# Patient Record
Sex: Female | Born: 1960 | Race: White | Hispanic: No | Marital: Married | Smoking: Never smoker
Health system: Southern US, Community
[De-identification: ages and names within clinical notes are randomized; demographics above are authoritative.]

## PROBLEM LIST (undated history)

## (undated) DIAGNOSIS — K859 Acute pancreatitis without necrosis or infection, unspecified: Secondary | ICD-10-CM

## (undated) DIAGNOSIS — R569 Unspecified convulsions: Secondary | ICD-10-CM

## (undated) HISTORY — PX: TONSILLECTOMY: SUR1361

## (undated) HISTORY — PX: APPENDECTOMY: SHX54

---

## 2001-05-29 ENCOUNTER — Inpatient Hospital Stay (HOSPITAL_COMMUNITY): Admission: EM | Admit: 2001-05-29 | Discharge: 2001-06-01 | Payer: Self-pay | Admitting: *Deleted

## 2004-06-01 ENCOUNTER — Emergency Department: Payer: Self-pay | Admitting: Emergency Medicine

## 2004-06-01 ENCOUNTER — Other Ambulatory Visit: Payer: Self-pay

## 2004-06-24 ENCOUNTER — Inpatient Hospital Stay: Payer: Self-pay

## 2004-08-06 ENCOUNTER — Emergency Department: Payer: Self-pay | Admitting: Emergency Medicine

## 2004-08-06 ENCOUNTER — Other Ambulatory Visit: Payer: Self-pay

## 2004-09-22 ENCOUNTER — Emergency Department: Payer: Self-pay | Admitting: Unknown Physician Specialty

## 2004-10-31 ENCOUNTER — Inpatient Hospital Stay: Payer: Self-pay | Admitting: Anesthesiology

## 2005-05-03 IMAGING — CR DG CHEST 1V PORT
1 series · 1 of 1 positions shown · non-contrast
Comparison: none

REASON FOR EXAM: chest pain   [HOSPITAL]
COMMENTS:

PROCEDURE:     DXR - DXR PORTABLE CHEST SINGLE VIEW  - September 22, 2004  [DATE]
RESULT:      The lungs are clear.  The heart and pulmonary vessels are
normal.  The bony and mediastinal structures are unremarkable.  There is no
effusion or pneumothorax.

[view not recorded]
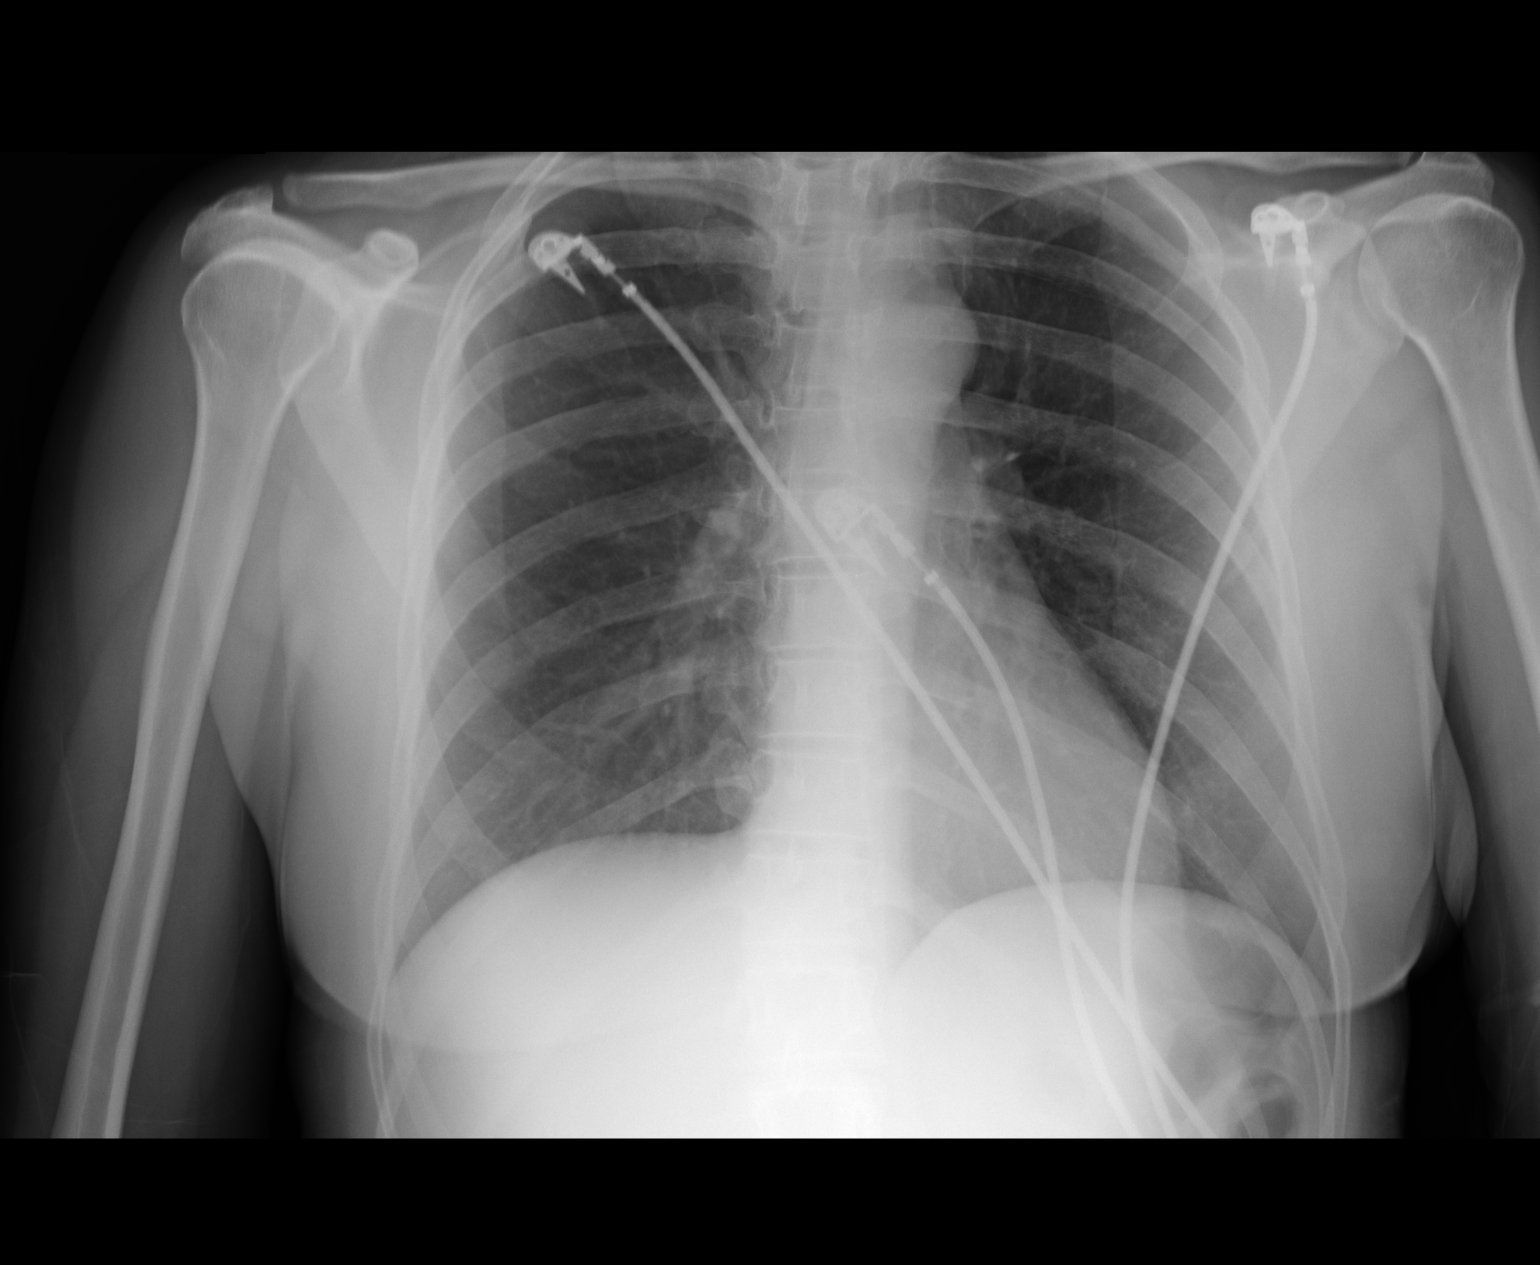

[1 of 1 positions shown; findings below may reference images not displayed]

IMPRESSION: No acute cardiopulmonary disease.

## 2018-07-13 ENCOUNTER — Other Ambulatory Visit: Payer: Self-pay

## 2018-07-13 ENCOUNTER — Inpatient Hospital Stay
Admission: EM | Admit: 2018-07-13 | Discharge: 2018-07-21 | DRG: 896 | Disposition: A | Discharge status: Home / Self Care | Payer: Self-pay | Attending: Internal Medicine | Admitting: Internal Medicine

## 2018-07-13 ENCOUNTER — Emergency Department: Payer: Self-pay

## 2018-07-13 DIAGNOSIS — F10230 Alcohol dependence with withdrawal, uncomplicated: Secondary | ICD-10-CM | POA: Diagnosis present

## 2018-07-13 DIAGNOSIS — K228 Other specified diseases of esophagus: Secondary | ICD-10-CM

## 2018-07-13 DIAGNOSIS — K222 Esophageal obstruction: Secondary | ICD-10-CM | POA: Diagnosis present

## 2018-07-13 DIAGNOSIS — F10929 Alcohol use, unspecified with intoxication, unspecified: Secondary | ICD-10-CM | POA: Diagnosis present

## 2018-07-13 DIAGNOSIS — K2289 Other specified disease of esophagus: Secondary | ICD-10-CM

## 2018-07-13 DIAGNOSIS — K219 Gastro-esophageal reflux disease without esophagitis: Secondary | ICD-10-CM | POA: Diagnosis present

## 2018-07-13 DIAGNOSIS — Z79899 Other long term (current) drug therapy: Secondary | ICD-10-CM

## 2018-07-13 DIAGNOSIS — K449 Diaphragmatic hernia without obstruction or gangrene: Secondary | ICD-10-CM | POA: Diagnosis present

## 2018-07-13 DIAGNOSIS — R600 Localized edema: Secondary | ICD-10-CM | POA: Diagnosis present

## 2018-07-13 DIAGNOSIS — R791 Abnormal coagulation profile: Secondary | ICD-10-CM | POA: Diagnosis present

## 2018-07-13 DIAGNOSIS — K7030 Alcoholic cirrhosis of liver without ascites: Secondary | ICD-10-CM | POA: Diagnosis present

## 2018-07-13 DIAGNOSIS — E8729 Other acidosis: Secondary | ICD-10-CM

## 2018-07-13 DIAGNOSIS — F10930 Alcohol use, unspecified with withdrawal, uncomplicated: Secondary | ICD-10-CM

## 2018-07-13 DIAGNOSIS — K852 Alcohol induced acute pancreatitis without necrosis or infection: Secondary | ICD-10-CM | POA: Diagnosis present

## 2018-07-13 DIAGNOSIS — F10229 Alcohol dependence with intoxication, unspecified: Principal | ICD-10-CM | POA: Diagnosis present

## 2018-07-13 DIAGNOSIS — E876 Hypokalemia: Secondary | ICD-10-CM | POA: Diagnosis present

## 2018-07-13 DIAGNOSIS — F10288 Alcohol dependence with other alcohol-induced disorder: Secondary | ICD-10-CM | POA: Diagnosis present

## 2018-07-13 DIAGNOSIS — E860 Dehydration: Secondary | ICD-10-CM | POA: Diagnosis present

## 2018-07-13 DIAGNOSIS — D61818 Other pancytopenia: Secondary | ICD-10-CM | POA: Diagnosis present

## 2018-07-13 DIAGNOSIS — K3189 Other diseases of stomach and duodenum: Secondary | ICD-10-CM | POA: Diagnosis present

## 2018-07-13 DIAGNOSIS — K766 Portal hypertension: Secondary | ICD-10-CM | POA: Diagnosis present

## 2018-07-13 DIAGNOSIS — E872 Acidosis: Secondary | ICD-10-CM | POA: Diagnosis present

## 2018-07-13 DIAGNOSIS — Z8669 Personal history of other diseases of the nervous system and sense organs: Secondary | ICD-10-CM

## 2018-07-13 DIAGNOSIS — Z885 Allergy status to narcotic agent status: Secondary | ICD-10-CM

## 2018-07-13 DIAGNOSIS — K2920 Alcoholic gastritis without bleeding: Secondary | ICD-10-CM | POA: Diagnosis present

## 2018-07-13 DIAGNOSIS — K760 Fatty (change of) liver, not elsewhere classified: Secondary | ICD-10-CM | POA: Diagnosis present

## 2018-07-13 DIAGNOSIS — R1314 Dysphagia, pharyngoesophageal phase: Secondary | ICD-10-CM | POA: Diagnosis present

## 2018-07-13 HISTORY — DX: Unspecified convulsions: R56.9

## 2018-07-13 LAB — CBC
HCT: 33.6 % — ABNORMAL LOW (ref 36.0–46.0)
Hemoglobin: 11.6 g/dL — ABNORMAL LOW (ref 12.0–15.0)
MCH: 31.2 pg (ref 26.0–34.0)
MCHC: 34.5 g/dL (ref 30.0–36.0)
MCV: 90.3 fL (ref 80.0–100.0)
Platelets: 87 10*3/uL — ABNORMAL LOW (ref 150–400)
RBC: 3.72 MIL/uL — ABNORMAL LOW (ref 3.87–5.11)
RDW: 15.6 % — ABNORMAL HIGH (ref 11.5–15.5)
WBC: 5.6 10*3/uL (ref 4.0–10.5)
nRBC: 0 % (ref 0.0–0.2)

## 2018-07-13 LAB — URINE DRUG SCREEN, QUALITATIVE (ARMC ONLY)
Amphetamines, Ur Screen: NOT DETECTED
Barbiturates, Ur Screen: NOT DETECTED
Benzodiazepine, Ur Scrn: NOT DETECTED
Cannabinoid 50 Ng, Ur ~~LOC~~: NOT DETECTED
Cocaine Metabolite,Ur ~~LOC~~: NOT DETECTED
MDMA (Ecstasy)Ur Screen: NOT DETECTED
METHADONE SCREEN, URINE: NOT DETECTED
Opiate, Ur Screen: NOT DETECTED
Phencyclidine (PCP) Ur S: NOT DETECTED
Tricyclic, Ur Screen: NOT DETECTED

## 2018-07-13 LAB — COMPREHENSIVE METABOLIC PANEL
ALT: 100 U/L — ABNORMAL HIGH (ref 0–44)
AST: 195 U/L — ABNORMAL HIGH (ref 15–41)
Albumin: 4.6 g/dL (ref 3.5–5.0)
Alkaline Phosphatase: 133 U/L — ABNORMAL HIGH (ref 38–126)
Anion gap: 22 — ABNORMAL HIGH (ref 5–15)
BUN: 10 mg/dL (ref 6–20)
CO2: 20 mmol/L — ABNORMAL LOW (ref 22–32)
Calcium: 9 mg/dL (ref 8.9–10.3)
Chloride: 90 mmol/L — ABNORMAL LOW (ref 98–111)
Creatinine, Ser: 0.46 mg/dL (ref 0.44–1.00)
Glucose, Bld: 174 mg/dL — ABNORMAL HIGH (ref 70–99)
Potassium: 3 mmol/L — ABNORMAL LOW (ref 3.5–5.1)
Sodium: 132 mmol/L — ABNORMAL LOW (ref 135–145)
Total Bilirubin: 3.6 mg/dL — ABNORMAL HIGH (ref 0.3–1.2)
Total Protein: 8.3 g/dL — ABNORMAL HIGH (ref 6.5–8.1)

## 2018-07-13 LAB — BLOOD GAS, VENOUS
Acid-base deficit: 3.3 mmol/L — ABNORMAL HIGH (ref 0.0–2.0)
Bicarbonate: 19.9 mmol/L — ABNORMAL LOW (ref 20.0–28.0)
O2 Saturation: 95.5 %
Patient temperature: 37
pCO2, Ven: 30 mmHg — ABNORMAL LOW (ref 44.0–60.0)
pH, Ven: 7.43 (ref 7.250–7.430)
pO2, Ven: 76 mmHg — ABNORMAL HIGH (ref 32.0–45.0)

## 2018-07-13 LAB — LIPASE, BLOOD
Lipase: 343 U/L — ABNORMAL HIGH (ref 11–51)
Lipase: 317 U/L — ABNORMAL HIGH (ref 11–51)

## 2018-07-13 LAB — TSH: TSH: 0.491 u[IU]/mL (ref 0.350–4.500)

## 2018-07-13 LAB — ETHANOL: ALCOHOL ETHYL (B): 112 mg/dL — AB (ref <10)

## 2018-07-13 MED ORDER — METOCLOPRAMIDE HCL 5 MG/ML IJ SOLN
10.0000 mg | Freq: Once | INTRAMUSCULAR | Status: AC
  Administered 2018-07-13: 10 mg via INTRAVENOUS
  Filled 2018-07-13: qty 2

## 2018-07-13 MED ORDER — LORAZEPAM 2 MG/ML IJ SOLN
0.0000 mg | Freq: Four times a day (QID) | INTRAMUSCULAR | Status: AC
  Administered 2018-07-14: 1 mg via INTRAVENOUS
  Filled 2018-07-13: qty 1

## 2018-07-13 MED ORDER — TRAZODONE HCL 100 MG PO TABS
100.0000 mg | ORAL_TABLET | Freq: Every day | ORAL | Status: DC
  Administered 2018-07-13 – 2018-07-20 (×8): 100 mg via ORAL
  Filled 2018-07-13 (×8): qty 1

## 2018-07-13 MED ORDER — ONDANSETRON HCL 4 MG/2ML IJ SOLN
4.0000 mg | Freq: Four times a day (QID) | INTRAMUSCULAR | Status: DC | PRN
  Administered 2018-07-13 – 2018-07-19 (×14): 4 mg via INTRAVENOUS
  Filled 2018-07-13 (×15): qty 2

## 2018-07-13 MED ORDER — DILTIAZEM HCL 25 MG/5ML IV SOLN
15.0000 mg | Freq: Once | INTRAVENOUS | Status: DC
  Filled 2018-07-13: qty 5

## 2018-07-13 MED ORDER — SODIUM CHLORIDE 0.9 % IV BOLUS
2000.0000 mL | Freq: Once | INTRAVENOUS | Status: AC
  Administered 2018-07-13: 2000 mL via INTRAVENOUS

## 2018-07-13 MED ORDER — ADULT MULTIVITAMIN W/MINERALS CH
1.0000 | ORAL_TABLET | Freq: Every day | ORAL | Status: DC
  Administered 2018-07-14 – 2018-07-21 (×8): 1 via ORAL
  Filled 2018-07-13 (×8): qty 1

## 2018-07-13 MED ORDER — IOPAMIDOL (ISOVUE-300) INJECTION 61%
75.0000 mL | Freq: Once | INTRAVENOUS | Status: AC | PRN
  Administered 2018-07-13: 75 mL via INTRAVENOUS

## 2018-07-13 MED ORDER — IOPAMIDOL (ISOVUE-300) INJECTION 61%: 100.0000 mL | Freq: Once | INTRAVENOUS | Status: DC | PRN

## 2018-07-13 MED ORDER — POTASSIUM CHLORIDE IN NACL 20-0.9 MEQ/L-% IV SOLN
INTRAVENOUS | Status: DC
  Administered 2018-07-14: 04:00:00 via INTRAVENOUS
  Filled 2018-07-13: qty 1000

## 2018-07-13 MED ORDER — LORAZEPAM 1 MG PO TABS
1.0000 mg | ORAL_TABLET | Freq: Four times a day (QID) | ORAL | Status: AC | PRN
  Administered 2018-07-13: 1 mg via ORAL
  Filled 2018-07-13: qty 1

## 2018-07-13 MED ORDER — POTASSIUM CHLORIDE 10 MEQ/100ML IV SOLN
10.0000 meq | INTRAVENOUS | Status: DC
  Filled 2018-07-13 (×6): qty 100

## 2018-07-13 MED ORDER — LORAZEPAM 2 MG/ML IJ SOLN: 0.0000 mg | Freq: Two times a day (BID) | INTRAMUSCULAR | Status: AC

## 2018-07-13 MED ORDER — ACETAMINOPHEN 325 MG PO TABS
650.0000 mg | ORAL_TABLET | Freq: Four times a day (QID) | ORAL | Status: DC | PRN
  Filled 2018-07-13: qty 2

## 2018-07-13 MED ORDER — POTASSIUM CHLORIDE IN NACL 20-0.9 MEQ/L-% IV SOLN
INTRAVENOUS | Status: DC
  Filled 2018-07-13 (×2): qty 1000

## 2018-07-13 MED ORDER — PANTOPRAZOLE SODIUM 40 MG PO TBEC
40.0000 mg | DELAYED_RELEASE_TABLET | Freq: Two times a day (BID) | ORAL | Status: DC
  Administered 2018-07-13 – 2018-07-16 (×6): 40 mg via ORAL
  Filled 2018-07-13 (×6): qty 1

## 2018-07-13 MED ORDER — PANTOPRAZOLE SODIUM 40 MG IV SOLR
40.0000 mg | Freq: Once | INTRAVENOUS | Status: AC
  Administered 2018-07-13: 40 mg via INTRAVENOUS
  Filled 2018-07-13: qty 40

## 2018-07-13 MED ORDER — POTASSIUM CHLORIDE 10 MEQ/100ML IV SOLN
10.0000 meq | INTRAVENOUS | Status: AC
  Administered 2018-07-13 – 2018-07-14 (×5): 10 meq via INTRAVENOUS
  Filled 2018-07-13 (×5): qty 100

## 2018-07-13 MED ORDER — LORAZEPAM 2 MG/ML IJ SOLN
1.0000 mg | Freq: Four times a day (QID) | INTRAMUSCULAR | Status: AC | PRN
  Filled 2018-07-13: qty 1

## 2018-07-13 MED ORDER — ONDANSETRON HCL 4 MG PO TABS
4.0000 mg | ORAL_TABLET | Freq: Four times a day (QID) | ORAL | Status: DC | PRN
  Administered 2018-07-18: 4 mg via ORAL
  Filled 2018-07-13: qty 1

## 2018-07-13 MED ORDER — LORAZEPAM 2 MG/ML IJ SOLN
4.0000 mg | Freq: Once | INTRAMUSCULAR | Status: AC
  Administered 2018-07-13: 4 mg via INTRAVENOUS
  Filled 2018-07-13: qty 2

## 2018-07-13 MED ORDER — FOLIC ACID 1 MG PO TABS
1.0000 mg | ORAL_TABLET | Freq: Every day | ORAL | Status: DC
  Administered 2018-07-14 – 2018-07-21 (×8): 1 mg via ORAL
  Filled 2018-07-13 (×8): qty 1

## 2018-07-13 MED ORDER — ENOXAPARIN SODIUM 40 MG/0.4ML ~~LOC~~ SOLN
40.0000 mg | SUBCUTANEOUS | Status: DC
  Administered 2018-07-13: 40 mg via SUBCUTANEOUS
  Filled 2018-07-13: qty 0.4

## 2018-07-13 MED ORDER — THIAMINE HCL 100 MG/ML IJ SOLN
100.0000 mg | Freq: Every day | INTRAMUSCULAR | Status: DC
  Administered 2018-07-13: 100 mg via INTRAVENOUS
  Filled 2018-07-13: qty 2

## 2018-07-13 MED ORDER — VITAMIN B-1 100 MG PO TABS
100.0000 mg | ORAL_TABLET | Freq: Every day | ORAL | Status: DC
  Administered 2018-07-14 – 2018-07-21 (×8): 100 mg via ORAL
  Filled 2018-07-13 (×8): qty 1

## 2018-07-13 MED ORDER — ACETAMINOPHEN 650 MG RE SUPP: 650.0000 mg | Freq: Four times a day (QID) | RECTAL | Status: DC | PRN

## 2018-07-13 MED ORDER — ONDANSETRON HCL 4 MG/2ML IJ SOLN
4.0000 mg | Freq: Once | INTRAMUSCULAR | Status: AC
  Administered 2018-07-13: 4 mg via INTRAVENOUS
  Filled 2018-07-13: qty 2

## 2018-07-13 NOTE — ED Notes (Signed)
Pt transported to room 209 

## 2018-07-13 NOTE — ED Triage Notes (Signed)
Pt arrives via ems from home. Ems reports pt drinking 1 pint of liquor this morning. Pt c/o epigastric/chest pain. Oriented to self and situation on arrival, unable to tell me what month it is or what time she consumed the alcohol this morning. Pt extremely nauseous and dry heaving on arrival. Pt states she did have one episode of emesis, describes it as clear.

## 2018-07-13 NOTE — ED Notes (Signed)
ED TO INPATIENT HANDOFF REPORT  Name/Age/Gender Kaitlyn Farrell 58 y.o. female  Code Status   Home/SNF/Other Home  Chief Complaint chest pain  Level of Care/Admitting Diagnosis ED Disposition    ED Disposition Condition Comment   Admit  Hospital Area: [facility] [[id number]]  Level of Care: Med-Surg [16]  Diagnosis: Alcohol intoxication [facility]) [[id number]]  Admitting Physician: Enid Baas [[id number]]  Attending Physician: Enid Baas [[id number]]  Estimated length of stay: past midnight tomorrow  Certification:: I certify this patient will need inpatient services for at least 2 midnights  PT Class (Do Not Modify): Inpatient [101]  PT Acc Code (Do Not Modify): Private [1]       Medical History Past Medical History:  Diagnosis Date  . Seizures (HCC)     Allergies Allergies  Allergen Reactions  . Hydrocodone Itching and Rash    IV Location/Drains/Wounds Patient Lines/Drains/Airways Status   Active Line/Drains/Airways    Name:   Placement date:   Placement time:   Site:   Days:   Peripheral IV 07/13/18 Right Forearm   07/13/18    0930    Forearm   less than 1          Labs/Imaging Results for orders placed or performed during the hospital encounter of 07/13/18 (from the past 48 hour(s))  Comprehensive metabolic panel     Status: Abnormal   Collection Time: 07/13/18  9:30 AM  Result Value Ref Range   Sodium 132 (L) 135 - 145 mmol/L    Comment: RESULTS VERIFIED BY REPEAT TESTING /HKP   Potassium 3.0 (L) 3.5 - 5.1 mmol/L   Chloride 90 (L) 98 - 111 mmol/L   CO2 20 (L) 22 - 32 mmol/L   Glucose, Bld 174 (H) 70 - 99 mg/dL   BUN 10 6 - 20 mg/dL   Creatinine, Ser 3.25 0.44 - 1.00 mg/dL   Calcium 9.0 8.9 - 49.8 mg/dL   Total Protein 8.3 (H) 6.5 - 8.1 g/dL   Albumin 4.6 3.5 - 5.0 g/dL   AST 264 (H) 15 - 41 U/L   ALT 100 (H) 0 - 44 U/L   Alkaline Phosphatase 133 (H) 38 - 126 U/L   Total Bilirubin 3.6 (H) 0.3 - 1.2 mg/dL   GFR calc non  Af Amer >60 >60 mL/min   GFR calc Af Amer >60 >60 mL/min   Anion gap 22 (H) 5 - 15    Comment: Performed at [facility], [address] [place], [place] [address]  cbc     Status: Abnormal   Collection Time: 07/13/18  9:30 AM  Result Value Ref Range   WBC 5.6 4.0 - 10.5 K/uL   RBC 3.72 (L) 3.87 - 5.11 MIL/uL   Hemoglobin 11.6 (L) 12.0 - 15.0 g/dL   HCT 94.0 (L) 76.8 - 08.8 %   MCV 90.3 80.0 - 100.0 fL   MCH 31.2 26.0 - 34.0 pg   MCHC 34.5 30.0 - 36.0 g/dL   RDW 11.0 (H) 31.5 - 94.5 %   Platelets 87 (L) 150 - 400 K/uL    Comment: Immature Platelet Fraction may be clinically indicated, consider ordering this additional test [id number]    nRBC 0.0 0.0 - 0.2 %    Comment: Performed at [facility], [address] [place], [place] [address]  Lipase, blood     Status: Abnormal   Collection Time: 07/13/18  9:30 AM  Result Value Ref Range   Lipase 343 (H) 11 -  51 U/L    Comment: Performed at [facility], [address] [place], [place] [address]  Blood gas, venous     Status: Abnormal   Collection Time: 07/13/18 10:05 AM  Result Value Ref Range   pH, Ven 7.43 7.250 - 7.430   pCO2, Ven 30 (L) 44.0 - 60.0 mmHg   pO2, Ven 76.0 (H) 32.0 - 45.0 mmHg   Bicarbonate 19.9 (L) 20.0 - 28.0 mmol/L   Acid-base deficit 3.3 (H) 0.0 - 2.0 mmol/L   O2 Saturation 95.5 %   Patient temperature 37.0    Collection site VEIN    Sample type VENOUS     Comment: Performed at [facility], [address] [place], [place] [address]  Ethanol     Status: Abnormal   Collection Time: 07/13/18 10:15 AM  Result Value Ref Range   Alcohol, Ethyl (B) 112 (H) <10 mg/dL    Comment: (NOTE) Lowest detectable limit for serum alcohol is 10 mg/dL. For medical purposes only. Performed at [facility], [address] [place], [place] [address]    Ct Head Wo Contrast  Result Date: 07/13/2018 CLINICAL DATA:  Head trauma. EXAM: CT HEAD WITHOUT CONTRAST TECHNIQUE:  Contiguous axial images were obtained from the base of the skull through the vertex without intravenous contrast. COMPARISON:  None. FINDINGS: Brain: Mild atrophy. Negative for hydrocephalus. Negative for acute infarct, hemorrhage, or mass. Vascular: Negative for hyperdense vessel Skull: Negative for skull fracture.  Right parietal scalp contusion Sinuses/Orbits: Negative Other: None IMPRESSION: No acute abnormality.  Generalized atrophy. Electronically Signed   By: Marlan Palau M.D.   On: 07/13/2018 11:43   Ct Abdomen Pelvis W Contrast  Result Date: 07/13/2018 CLINICAL DATA:  Epigastric pain following drinking alcohol, initial encounter EXAM: CT ABDOMEN AND PELVIS WITH CONTRAST TECHNIQUE: Multidetector CT imaging of the abdomen and pelvis was performed using the standard protocol following bolus administration of intravenous contrast. CONTRAST:  64mL ISOVUE-300 IOPAMIDOL (ISOVUE-300) INJECTION 61% COMPARISON:  None. FINDINGS: Lower chest: No acute abnormality. Hepatobiliary: Diffuse fatty infiltration of the liver is noted. The gallbladder is well distended. Pancreas: Unremarkable. No pancreatic ductal dilatation or surrounding inflammatory changes. Spleen: Normal in size without focal abnormality. Adrenals/Urinary Tract: The adrenal glands are within normal limits. Kidneys demonstrate no renal calculi or obstructive changes. Normal excretion of contrast is noted on delayed images. Bladder is well distended. Stomach/Bowel: Scattered diverticular change of the colon is noted without evidence of diverticulitis. The appendix is not well visualized consistent with a prior surgical history. No small bowel obstructive or inflammatory changes are seen. A duodenal diverticulum is noted adjacent to the uncinate process of the pancreas. Stomach is decompressed. Small sliding-type hiatal hernia is noted. Some thickening of the distal esophagus is noted which may be related to reflux and mucosal edema although the  possibility of an obstructing mass could not be totally excluded. Fluoroscopic evaluation may be helpful. Vascular/Lymphatic: Aortic atherosclerosis. No enlarged abdominal or pelvic lymph nodes. Reproductive: Uterus and bilateral adnexa are unremarkable. Other: No abdominal wall hernia or abnormality. No abdominopelvic ascites. Musculoskeletal: Degenerative changes of the lumbar spine are noted. IMPRESSION: Thickening in the distal esophagus which may be related to mucosal edema from reflux although the possibility of a filling defect could not be totally excluded. Fluoroscopic evaluation is recommended. Fatty infiltration of the liver. Chronic changes without acute abnormality. Electronically Signed   By: Alcide Clever M.D.   On: 07/13/2018 11:46    Pending Labs [employer] (From  admission, onward)    Start     Ordered   07/13/18 1234  Lipase, blood  ONCE - STAT,   STAT     07/13/18 1233   07/13/18 0930  Urine Drug Screen, Qualitative  Once,   STAT     07/13/18 0930   Signed and Held  HIV antibody (Routine Testing)  Once,   R     Signed and Held   Signed and Held  TSH  Once,   R     Signed and Held   Signed and Held  Comprehensive metabolic panel  Tomorrow morning,   R     Signed and Held   Medical illustratorigned and Held  CBC  Tomorrow morning,   R     Signed and Held   Signed and Held  Protime-INR  Tomorrow morning,   R     Signed and Held   Signed and Held  Lipase, blood  Tomorrow morning,   R     Signed and Held          Vitals/Pain Today's Vitals   07/13/18 1030 07/13/18 1100 07/13/18 1200 07/13/18 1213  BP: 128/88 (!) 146/84 (!) 144/87 (!) 144/87  Pulse:   96 (!) 105  Resp: 18 16    Temp:      TempSrc:      SpO2:   95%   Weight:      Height:      PainSc:        Isolation Precautions No active isolations  Medications Medications  potassium chloride 10 mEq in 100 mL IVPB (has no administration in time range)  sodium chloride 0.9 % bolus 2,000 mL (0 mLs Intravenous Stopped  07/13/18 1211)  ondansetron (ZOFRAN) injection 4 mg (4 mg Intravenous Given 07/13/18 1018)  pantoprazole (PROTONIX) injection 40 mg (40 mg Intravenous Given 07/13/18 1026)  metoCLOPramide (REGLAN) injection 10 mg (10 mg Intravenous Given 07/13/18 1019)  iopamidol (ISOVUE-300) 61 % injection 75 mL (75 mLs Intravenous Contrast Given 07/13/18 1119)  LORazepam (ATIVAN) injection 4 mg (4 mg Intravenous Given 07/13/18 1215)    Mobility walks with person assist

## 2018-07-13 NOTE — ED Notes (Signed)
Resting.  NAD.  Skin warm and dry.

## 2018-07-13 NOTE — ED Provider Notes (Signed)
[facility] Emergency Department Provider Note  ____________________________________________  Time seen: Approximately 12:03 PM  I have reviewed the triage vital signs and the nursing notes.   HISTORY  Chief Complaint Alcohol Intoxication and Chest Pain  Level 5 Caveat: Portions of the History and Physical including HPI and review of systems are unable to be completely obtained due to patient being a poor historian    HPI Oneida ArenasMarilyn M Parkerson is a 58 y.o. female with a history of alcohol abuse and seizures who reports drinking approximately half a gallon of liquor daily for the past week.  Today she has generalized abdominal pain and vomiting.  No diarrhea.  No black or bloody stool.  No hematemesis.  Denies dizziness or syncope.      Past Medical History:  Diagnosis Date  . Seizures (HCC)      There are no active problems to display for this patient.    Past Surgical History:  Procedure Laterality Date  . APPENDECTOMY    . TONSILLECTOMY       Prior to Admission medications   Medication Sig Start Date End Date Taking? Authorizing Provider  esomeprazole (NEXIUM) 20 MG capsule Take 20 mg by mouth daily at 12 noon.   Yes [provider]  traZODone (DESYREL) 100 MG tablet Take 100 mg by mouth at bedtime.   Yes [provider]     Allergies Hydrocodone   History reviewed. No pertinent family history.  Social History Social History   Tobacco Use  . Smoking status: Never Smoker  . Smokeless tobacco: Never Used  Substance Use Topics  . Alcohol use: Yes  . Drug use: Never    Review of Systems  Constitutional:   No fever or chills.  ENT:   No sore throat. No rhinorrhea. Cardiovascular:   No chest pain or syncope. Respiratory:   No dyspnea or cough. Gastrointestinal: Positive upper abdominal pain and vomiting. Musculoskeletal:   Negative for focal pain or swelling All other systems reviewed and are negative except as  documented above in ROS and HPI.  ____________________________________________   PHYSICAL EXAM:  VITAL SIGNS: ED Triage Vitals  Enc Vitals Group     BP 07/13/18 0924 137/88     Pulse Rate 07/13/18 0924 (!) 125     Resp 07/13/18 0924 18     Temp 07/13/18 0924 98.6 F (37 C)     Temp Source 07/13/18 0924 Oral     SpO2 07/13/18 0924 96 %     Weight 07/13/18 0925 105 lb (47.6 kg)     Height 07/13/18 0925 5\' 2"  (1.575 m)     Head Circumference --      Peak Flow --      Pain Score 07/13/18 0925 8     Pain Loc --      Pain Edu? --      Excl. in GC? --     Vital signs reviewed, nursing assessments reviewed.   Constitutional:   Alert and oriented.  Ill-appearing Eyes:   Conjunctivae are normal. EOMI. PERRL. ENT      Head:   Normocephalic and atraumatic.      Nose:   No congestion/rhinnorhea.       Mouth/Throat:   Dry mucous membranes, no pharyngeal erythema. No peritonsillar mass.       Neck:   No meningismus. Full ROM. Hematological/Lymphatic/Immunilogical:   No cervical lymphadenopathy. Cardiovascular:   Tachycardia heart rate 110, regular. Symmetric bilateral radial and DP pulses.  No murmurs. Cap refill less than 2 seconds. Respiratory:   Normal respiratory effort without tachypnea/retractions. Breath sounds are clear and equal bilaterally. No wheezes/rales/rhonchi. Gastrointestinal:   Soft with diffuse upper abdominal tenderness.. Non distended. There is no CVA tenderness.  No rebound, rigidity, or guarding. Musculoskeletal:   Normal range of motion in all extremities. No joint effusions.  No lower extremity tenderness.  No edema. Neurologic:   Normal speech and language.  Very tremulous Motor grossly intact. Finger-to-nose normal No acute focal neurologic deficits are appreciated.  Skin:    Skin is warm, dry and intact. No rash noted.  No petechiae, purpura, or bullae.  ____________________________________________    LABS (pertinent positives/negatives) (all labs  ordered are listed, but only abnormal results are displayed) Labs Reviewed  COMPREHENSIVE METABOLIC PANEL - Abnormal; Notable for the following components:      Result Value   Sodium 132 (*)    Potassium 3.0 (*)    Chloride 90 (*)    CO2 20 (*)    Glucose, Bld 174 (*)    Total Protein 8.3 (*)    AST 195 (*)    ALT 100 (*)    Alkaline Phosphatase 133 (*)    Total Bilirubin 3.6 (*)    Anion gap 22 (*)    All other components within normal limits  CBC - Abnormal; Notable for the following components:   RBC 3.72 (*)    Hemoglobin 11.6 (*)    HCT 33.6 (*)    RDW 15.6 (*)    Platelets 87 (*)    All other components within normal limits  ETHANOL - Abnormal; Notable for the following components:   Alcohol, Ethyl (B) 112 (*)    All other components within normal limits  BLOOD GAS, VENOUS - Abnormal; Notable for the following components:   pCO2, Ven 30 (*)    pO2, Ven 76.0 (*)    Bicarbonate 19.9 (*)    Acid-base deficit 3.3 (*)    All other components within normal limits  LIPASE, BLOOD - Abnormal; Notable for the following components:   Lipase 343 (*)    All other components within normal limits  URINE DRUG SCREEN, QUALITATIVE ([facility] ONLY)  LIPASE, BLOOD  POC URINE PREG, ED   ____________________________________________   EKG  Interpreted by me Atrial fibrillation rate 142, normal axis intervals QRS ST segments and T waves.  Repeat EKG performed at 10:51 AM interpretation performed by me but limited by tremor and baseline artifact Sinus rhythm rate of 99, QTc 515, normal axis.  Normal QRS ST segments and T waves.   ____________________________________________    RADIOLOGY  Ct Head Wo Contrast  Result Date: 07/13/2018 CLINICAL DATA:  Head trauma. EXAM: CT HEAD WITHOUT CONTRAST TECHNIQUE: Contiguous axial images were obtained from the base of the skull through the vertex without intravenous contrast. COMPARISON:  None. FINDINGS: Brain: Mild atrophy. Negative for  hydrocephalus. Negative for acute infarct, hemorrhage, or mass. Vascular: Negative for hyperdense vessel Skull: Negative for skull fracture.  Right parietal scalp contusion Sinuses/Orbits: Negative Other: None IMPRESSION: No acute abnormality.  Generalized atrophy. Electronically Signed   By: Marlan Palauharles  Clark M.D.   On: 07/13/2018 11:43   Ct Abdomen Pelvis W Contrast  Result Date: 07/13/2018 CLINICAL DATA:  Epigastric pain following drinking alcohol, initial encounter EXAM: CT ABDOMEN AND PELVIS WITH CONTRAST TECHNIQUE: Multidetector CT imaging of the abdomen and pelvis was performed using the standard protocol following bolus administration of intravenous contrast. CONTRAST:  75mL ISOVUE-300 IOPAMIDOL (  ISOVUE-300) INJECTION 61% COMPARISON:  None. FINDINGS: Lower chest: No acute abnormality. Hepatobiliary: Diffuse fatty infiltration of the liver is noted. The gallbladder is well distended. Pancreas: Unremarkable. No pancreatic ductal dilatation or surrounding inflammatory changes. Spleen: Normal in size without focal abnormality. Adrenals/Urinary Tract: The adrenal glands are within normal limits. Kidneys demonstrate no renal calculi or obstructive changes. Normal excretion of contrast is noted on delayed images. Bladder is well distended. Stomach/Bowel: Scattered diverticular change of the colon is noted without evidence of diverticulitis. The appendix is not well visualized consistent with a prior surgical history. No small bowel obstructive or inflammatory changes are seen. A duodenal diverticulum is noted adjacent to the uncinate process of the pancreas. Stomach is decompressed. Small sliding-type hiatal hernia is noted. Some thickening of the distal esophagus is noted which may be related to reflux and mucosal edema although the possibility of an obstructing mass could not be totally excluded. Fluoroscopic evaluation may be helpful. Vascular/Lymphatic: Aortic atherosclerosis. No enlarged abdominal or pelvic  lymph nodes. Reproductive: Uterus and bilateral adnexa are unremarkable. Other: No abdominal wall hernia or abnormality. No abdominopelvic ascites. Musculoskeletal: Degenerative changes of the lumbar spine are noted. IMPRESSION: Thickening in the distal esophagus which may be related to mucosal edema from reflux although the possibility of a filling defect could not be totally excluded. Fluoroscopic evaluation is recommended. Fatty infiltration of the liver. Chronic changes without acute abnormality. Electronically Signed   By: Alcide Clever M.D.   On: 07/13/2018 11:46    ____________________________________________   PROCEDURES .Critical Care Performed by: Sharman Cheek, MD Authorized by: Sharman Cheek, MD   Critical care provider statement:    Critical care time (minutes):  35   Critical care time was exclusive of:  Separately billable procedures and treating other patients   Critical care was necessary to treat or prevent imminent or life-threatening deterioration of the following conditions:  Metabolic crisis and dehydration   Critical care was time spent personally by me on the following activities:  Development of treatment plan with patient or surrogate, discussions with consultants, evaluation of patient's response to treatment, examination of patient, obtaining history from patient or surrogate, ordering and performing treatments and interventions, ordering and review of laboratory studies, ordering and review of radiographic studies, pulse oximetry, re-evaluation of patient's condition and review of old charts    ____________________________________________  DIFFERENTIAL DIAGNOSIS   GI perforation, peptic ulcer disease, gastritis, pancreatitis, less likely bowel obstruction/biliary disease/AAA/dissection  CLINICAL IMPRESSION / ASSESSMENT AND PLAN / ED COURSE  Pertinent labs & imaging results that were available during my care of the patient were reviewed by me and  considered in my medical decision making (see chart for details).    Patient presents with severe abdominal pain and vomiting in the setting of excessive alcohol abuse.  Clinically appears to be withdrawing at this time and dehydrated.  Initial EKG shows atrial fibrillation which seems to have converted to sinus tachycardia with IV fluid hydration.  Clinical Course as of Jul 13 1252  Wed Jul 13, 2018  1202 CTs negative.  No intracranial hemorrhage, no GI perforation or other acute issues.  Will admit.  Ativan 4 mg IV for acute withdrawal.   [PS]    Clinical Course User Index [PS] Sharman Cheek, MD     ____________________________________________   FINAL CLINICAL IMPRESSION(S) / ED DIAGNOSES    Final diagnoses:  Alcoholic ketoacidosis  Dehydration  Alcohol withdrawal syndrome without complication [facility])     ED Discharge Orders  None      Portions of this note were generated with dragon dictation software. Dictation errors may occur despite best attempts at proofreading.   Sharman Cheek, MD 07/13/18 1254

## 2018-07-13 NOTE — H&P (Addendum)
[facility] [place] at [facility]   PATIENT NAME: Kaitlyn Farrell    MR#:  [id number]  DATE OF BIRTH:  07/23/1960  DATE OF ADMISSION:  07/13/2018  PRIMARY CARE PHYSICIAN: No primary care provider on file.   REQUESTING/REFERRING PHYSICIAN: Dr. Sharman CheekPhillip Stafford  CHIEF COMPLAINT:   Chief Complaint  Patient presents with  . Alcohol Intoxication  . Chest Pain    HISTORY OF PRESENT ILLNESS:  Kaitlyn MinionMarilyn Farrell  is a 58 y.o. female with a known history of seizures, could have been alcohol withdrawal seizures not on any medications, significant alcohol abuse history presents to hospital secondary to abdominal pain nausea and vomiting.  Patient is very intoxicated at this time, unable to provide any history.  Most of the history is obtained from old records.  Apparently called EMS secondary to epigastric pain nausea and vomiting this morning.  Patient drank a pint of liquor today.  Currently she is not vomiting, opening eyes to pain but not following commands.  Labs indicate acute pancreatitis.    CT of the abdomen done here, labs showing elevated lipase and hypokalemia.  She is being admitted for acute pancreatitis and alcohol intoxication.  PAST MEDICAL HISTORY:   Past Medical History:  Diagnosis Date  . Seizures (HCC)     PAST SURGICAL HISTORY:   Past Surgical History:  Procedure Laterality Date  . APPENDECTOMY    . TONSILLECTOMY      SOCIAL HISTORY:   Social History   Tobacco Use  . Smoking status: Never Smoker  . Smokeless tobacco: Never Used  Substance Use Topics  . Alcohol use: Yes    FAMILY HISTORY:   Family History  Family history unknown: Yes    DRUG ALLERGIES:   Allergies  Allergen Reactions  . Hydrocodone Itching and Rash    REVIEW OF SYSTEMS:   Review of Systems  Unable to perform ROS: Medical condition    MEDICATIONS AT HOME:   Prior to Admission medications   Medication Sig Start Date End Date Taking? Authorizing  Provider  esomeprazole (NEXIUM) 20 MG capsule Take 20 mg by mouth daily at 12 noon.   Yes [provider]  traZODone (DESYREL) 100 MG tablet Take 100 mg by mouth at bedtime.   Yes [provider]      VITAL SIGNS:  Blood pressure (!) 144/87, pulse (!) 105, temperature 98.6 F (37 C), temperature source Oral, resp. rate 16, height 5\' 2"  (1.575 m), weight 47.6 kg, SpO2 95 %.  PHYSICAL EXAMINATION:   Physical Exam  GENERAL:  58 y.o.-year-old patient lying in the bed, very intoxicated EYES: Pupils equal, round, reactive to light and accommodation. No scleral icterus. Extraocular muscles intact.  HEENT: Head atraumatic, normocephalic. Oropharynx and nasopharynx clear.  NECK:  Supple, no jugular venous distention. No thyroid enlargement, no tenderness.  LUNGS: Normal breath sounds bilaterally, no wheezing, rales,rhonchi or crepitation. No use of accessory muscles of respiration.  Decreased bibasilar breath sounds CARDIOVASCULAR: S1, S2 normal. No murmurs, rubs, or gallops.  ABDOMEN: Soft, nontender, nondistended. Bowel sounds present. No organomegaly or mass.  EXTREMITIES: No pedal edema, cyanosis, or clubbing.  NEUROLOGIC: Unable to do a neuro exam due to her mental status.  Moving all extremities in bed though..  PSYCHIATRIC: The patient is sedated SKIN: No obvious rash, lesion, or ulcer.   LABORATORY PANEL:   CBC Recent Labs  Lab 07/13/18 0930  WBC 5.6  HGB 11.6*  HCT 33.6*  PLT 87*   ------------------------------------------------------------------------------------------------------------------  Chemistries  Recent Labs  Lab 07/13/18 0930  NA 132*  K 3.0*  CL 90*  CO2 20*  GLUCOSE 174*  BUN 10  CREATININE 0.46  CALCIUM 9.0  AST 195*  ALT 100*  ALKPHOS 133*  BILITOT 3.6*   ------------------------------------------------------------------------------------------------------------------  Cardiac Enzymes No results for input(s): TROPONINI in  the last 168 hours. ------------------------------------------------------------------------------------------------------------------  RADIOLOGY:  Ct Head Wo Contrast  Result Date: 07/13/2018 CLINICAL DATA:  Head trauma. EXAM: CT HEAD WITHOUT CONTRAST TECHNIQUE: Contiguous axial images were obtained from the base of the skull through the vertex without intravenous contrast. COMPARISON:  None. FINDINGS: Brain: Mild atrophy. Negative for hydrocephalus. Negative for acute infarct, hemorrhage, or mass. Vascular: Negative for hyperdense vessel Skull: Negative for skull fracture.  Right parietal scalp contusion Sinuses/Orbits: Negative Other: None IMPRESSION: No acute abnormality.  Generalized atrophy. Electronically Signed   By: Marlan Palau M.D.   On: 07/13/2018 11:43   Ct Abdomen Pelvis W Contrast  Result Date: 07/13/2018 CLINICAL DATA:  Epigastric pain following drinking alcohol, initial encounter EXAM: CT ABDOMEN AND PELVIS WITH CONTRAST TECHNIQUE: Multidetector CT imaging of the abdomen and pelvis was performed using the standard protocol following bolus administration of intravenous contrast. CONTRAST:  17mL ISOVUE-300 IOPAMIDOL (ISOVUE-300) INJECTION 61% COMPARISON:  None. FINDINGS: Lower chest: No acute abnormality. Hepatobiliary: Diffuse fatty infiltration of the liver is noted. The gallbladder is well distended. Pancreas: Unremarkable. No pancreatic ductal dilatation or surrounding inflammatory changes. Spleen: Normal in size without focal abnormality. Adrenals/Urinary Tract: The adrenal glands are within normal limits. Kidneys demonstrate no renal calculi or obstructive changes. Normal excretion of contrast is noted on delayed images. Bladder is well distended. Stomach/Bowel: Scattered diverticular change of the colon is noted without evidence of diverticulitis. The appendix is not well visualized consistent with a prior surgical history. No small bowel obstructive or inflammatory changes are  seen. A duodenal diverticulum is noted adjacent to the uncinate process of the pancreas. Stomach is decompressed. Small sliding-type hiatal hernia is noted. Some thickening of the distal esophagus is noted which may be related to reflux and mucosal edema although the possibility of an obstructing mass could not be totally excluded. Fluoroscopic evaluation may be helpful. Vascular/Lymphatic: Aortic atherosclerosis. No enlarged abdominal or pelvic lymph nodes. Reproductive: Uterus and bilateral adnexa are unremarkable. Other: No abdominal wall hernia or abnormality. No abdominopelvic ascites. Musculoskeletal: Degenerative changes of the lumbar spine are noted. IMPRESSION: Thickening in the distal esophagus which may be related to mucosal edema from reflux although the possibility of a filling defect could not be totally excluded. Fluoroscopic evaluation is recommended. Fatty infiltration of the liver. Chronic changes without acute abnormality. Electronically Signed   By: Alcide Clever M.D.   On: 07/13/2018 11:46    EKG:   Orders placed or performed during the hospital encounter of 07/13/18  . EKG 12-Lead  . EKG 12-Lead    IMPRESSION AND PLAN:   Kyre Krantz  is a 58 y.o. female with a known history of seizures, could have been alcohol withdrawal seizures not on any medications, significant alcohol abuse history presents to hospital secondary to abdominal pain nausea and vomiting.  1.  Acute alcoholic pancreatitis-admit, IV fluids -Keep her n.p.o. -Check electrolytes.  Lipase is elevated.  2.  Alcohol intoxication-also check urine drug screen. -Placed on CIWA protocol. Monitor for withdrawals  3.  Hypokalemia-being replaced  4.  Elevated liver function tests-likely acute alcoholic hepatitis.  CT of the abdomen showing fatty infiltration of liver. -Monitor  5.  DVT  prophylaxis-started on Lovenox.  Due to her liver disease, she has low platelets.  Continue to monitor while on Lovenox.  6.  GERD- protonix    All the records are reviewed and case discussed with ED provider. Management plans discussed with the patient, family and they are in agreement.  CODE STATUS: Full Code  TOTAL TIME TAKING CARE OF THIS PATIENT: 51 minutes.    Enid Baas M.D on 07/13/2018 at 1:12 PM  Between 7am to 6pm - Pager - [phone]  After 6pm go to [email or web address] - Social research officer, government  [facility]  Office  [phone]  CC: Primary care physician; No primary care provider on file.

## 2018-07-14 LAB — COMPREHENSIVE METABOLIC PANEL
ALT: 75 U/L — AB (ref 0–44)
AST: 205 U/L — AB (ref 15–41)
Albumin: 3.6 g/dL (ref 3.5–5.0)
Alkaline Phosphatase: 95 U/L (ref 38–126)
Anion gap: 10 (ref 5–15)
BUN: <5 mg/dL — ABNORMAL LOW (ref 6–20)
CO2: 25 mmol/L (ref 22–32)
Calcium: 7.7 mg/dL — ABNORMAL LOW (ref 8.9–10.3)
Chloride: 100 mmol/L (ref 98–111)
Creatinine, Ser: 0.47 mg/dL (ref 0.44–1.00)
GFR calc Af Amer: >60 mL/min (ref >60)
GFR calc non Af Amer: >60 mL/min (ref >60)
GLUCOSE: 83 mg/dL (ref 70–99)
Potassium: 3.3 mmol/L — ABNORMAL LOW (ref 3.5–5.1)
Sodium: 135 mmol/L (ref 135–145)
Total Bilirubin: 6.4 mg/dL — ABNORMAL HIGH (ref 0.3–1.2)
Total Protein: 6.3 g/dL — ABNORMAL LOW (ref 6.5–8.1)

## 2018-07-14 LAB — CBC
HCT: 28.9 % — ABNORMAL LOW (ref 36.0–46.0)
Hemoglobin: 9.7 g/dL — ABNORMAL LOW (ref 12.0–15.0)
MCH: 31.6 pg (ref 26.0–34.0)
MCHC: 33.6 g/dL (ref 30.0–36.0)
MCV: 94.1 fL (ref 80.0–100.0)
Platelets: 47 10*3/uL — ABNORMAL LOW (ref 150–400)
RBC: 3.07 MIL/uL — ABNORMAL LOW (ref 3.87–5.11)
RDW: 15.7 % — ABNORMAL HIGH (ref 11.5–15.5)
WBC: 3.7 10*3/uL — ABNORMAL LOW (ref 4.0–10.5)
nRBC: 0 % (ref 0.0–0.2)

## 2018-07-14 LAB — PROTIME-INR
INR: 1.21
Prothrombin Time: 15.2 seconds (ref 11.4–15.2)

## 2018-07-14 LAB — PHOSPHORUS
Phosphorus: <1 mg/dL — CL (ref 2.5–4.6)
Phosphorus: <1 mg/dL — CL (ref 2.5–4.6)

## 2018-07-14 LAB — MAGNESIUM: Magnesium: 1.3 mg/dL — ABNORMAL LOW (ref 1.7–2.4)

## 2018-07-14 LAB — HIV ANTIBODY (ROUTINE TESTING W REFLEX): HIV Screen 4th Generation wRfx: NONREACTIVE

## 2018-07-14 LAB — LIPASE, BLOOD: Lipase: 133 U/L — ABNORMAL HIGH (ref 11–51)

## 2018-07-14 MED ORDER — MORPHINE SULFATE (PF) 2 MG/ML IV SOLN
1.0000 mg | INTRAVENOUS | Status: DC | PRN
  Administered 2018-07-14 – 2018-07-20 (×17): 1 mg via INTRAVENOUS
  Filled 2018-07-14 (×18): qty 1

## 2018-07-14 MED ORDER — MORPHINE SULFATE (PF) 2 MG/ML IV SOLN
2.0000 mg | INTRAVENOUS | Status: DC | PRN
  Administered 2018-07-14: 2 mg via INTRAVENOUS
  Filled 2018-07-14: qty 1

## 2018-07-14 MED ORDER — POTASSIUM PHOSPHATES 15 MMOLE/5ML IV SOLN
30.0000 mmol | Freq: Once | INTRAVENOUS | Status: AC
  Administered 2018-07-14: 30 mmol via INTRAVENOUS
  Filled 2018-07-14: qty 10

## 2018-07-14 MED ORDER — MAGNESIUM SULFATE 2 GM/50ML IV SOLN
2.0000 g | Freq: Once | INTRAVENOUS | Status: AC
  Administered 2018-07-14: 2 g via INTRAVENOUS
  Filled 2018-07-14: qty 50

## 2018-07-14 MED ORDER — POTASSIUM PHOSPHATES 15 MMOLE/5ML IV SOLN
45.0000 meq | Freq: Once | INTRAVENOUS | Status: AC
  Administered 2018-07-15: 45 meq via INTRAVENOUS
  Filled 2018-07-14: qty 10.23

## 2018-07-14 MED ORDER — SODIUM CHLORIDE 0.9 % IV SOLN
INTRAVENOUS | Status: DC
  Administered 2018-07-14 – 2018-07-15 (×3): via INTRAVENOUS

## 2018-07-14 NOTE — Consult Note (Signed)
PHARMACY CONSULT NOTE  Pharmacy Consult for Electrolyte Monitoring and Replacement   Recent Labs: Potassium (mmol/L)  Date Value  07/14/2018 3.3 (L)   Magnesium (mg/dL)  Date Value  79/39/0300 1.3 (L)   Calcium (mg/dL)  Date Value  92/33/0076 7.7 (L)   Albumin (g/dL)  Date Value  22/63/3354 3.6   Phosphorus (mg/dL)  Date Value  56/25/6389 <1.0 (LL)   Sodium (mmol/L)  Date Value  07/14/2018 135   Corrected Ca: 8.0  Assessment: 58 y.o. female with h/o seizures not on any medications, significant alcohol abuse history presents to hospital secondary to abdominal pain nausea and vomiting. She is at significant risk for re-feeding syndrome, therefore pharmacy will monitor potassium, phosphorous and magnesium closely at least for the first 3 days. On admission her potassium was 3.0 and she received a total of 60 mEq of IV KCl. Since 0400 this morning she has been receiving NS20 at 100 mL/hr  Goal of Therapy:  Electrolytes wnl  Plan:  01/30 @ 2114 Phos still < 1.0. Will supplement w/ Kphos 45 mEq IV x 1 over 6 hours, which should supply roughly 30 mmol of phos, and 45 mEq of K. Will recheck electrolytes w/ am labs.   Thomasene Ripple ,PharmD Clinical Pharmacist 07/14/2018 11:24 PM

## 2018-07-14 NOTE — Progress Notes (Signed)
CRITICAL VALUE ALERT  Critical Value:  Phosphorus 1  Date & Time Notied:  07/14/2018 at 8:30 am  Provider Notified: yes

## 2018-07-14 NOTE — Progress Notes (Signed)
Per MD okay for RN to change diet order for clear liquids.

## 2018-07-14 NOTE — Consult Note (Signed)
PHARMACY CONSULT NOTE  Pharmacy Consult for Electrolyte Monitoring and Replacement   Recent Labs: Potassium (mmol/L)  Date Value  07/14/2018 3.3 (L)   Magnesium (mg/dL)  Date Value  57/32/2025 1.3 (L)   Calcium (mg/dL)  Date Value  42/70/6237 7.7 (L)   Albumin (g/dL)  Date Value  62/83/1517 3.6   Phosphorus (mg/dL)  Date Value  61/60/7371 <1.0 (LL)   Sodium (mmol/L)  Date Value  07/14/2018 135   Corrected Ca: 8.0  Assessment: 58 y.o. female with h/o seizures not on any medications, significant alcohol abuse history presents to hospital secondary to abdominal pain nausea and vomiting. She is at significant risk for re-feeding syndrome, therefore pharmacy will monitor potassium, phosphorous and magnesium closely at least for the first 3 days. On admission her potassium was 3.0 and she received a total of 60 mEq of IV KCl. Since 0400 this morning she has been receiving NS20 at 100 mL/hr  Goal of Therapy:  Electrolytes wnl  Plan:  We will give IV potassium phosphate 30 mmol, which will provide 44 mEq of potassium. Additionally, we will give IV magnesium sulfate 2 grams IV once. IV fluids are being changed to normal saline at 100 mL/hr Pharmacy will follow these labs and replace if needed.  Thank you for allowing pharmacy to be a part of this patient's care.  Lowella Bandy ,PharmD Clinical Pharmacist 07/14/2018 9:02 AM

## 2018-07-14 NOTE — Progress Notes (Addendum)
[facility] - [place] at [facility]   PATIENT NAME: Kaitlyn Farrell    MR#:  [id number]  DATE OF BIRTH:  [birth year]  SUBJECTIVE: Patient admitted for alcohol intoxication, chest pain  And found to have alcohol induced pancreatitis. patient continues to have hand tremors, denies abdominal pain, nausea.  CHIEF COMPLAINT:   Chief Complaint  Patient presents with  . Alcohol Intoxication  . Chest Pain  CIWA score this morning was 10.  REVIEW OF SYSTEMS:   ROS CONSTITUTIONAL: No fever, fatigue or weakness.  Continues to have hand tremors. EYES: No blurred or double vision.  EARS, NOSE, AND THROAT: No tinnitus or ear pain.  RESPIRATORY: No cough, shortness of breath, wheezing or hemoptysis.  CARDIOVASCULAR: No chest pain, orthopnea, edema.  GASTROINTESTINAL: No nausea, vomiting, diarrhea or abdominal pain.  GENITOURINARY: No dysuria, hematuria.  ENDOCRINE: No polyuria, nocturia,  HEMATOLOGY: No anemia, easy bruising or bleeding SKIN: No rash or lesion. MUSCULOSKELETAL: No joint pain or arthritis.   NEUROLOGIC: No tingling, numbness, weakness.  PSYCHIATRY: Anxious.  DRUG ALLERGIES:   Allergies  Allergen Reactions  . Hydrocodone Itching and Rash    VITALS:  Blood pressure 126/89, pulse 78, temperature 97.9 F (36.6 C), temperature source Oral, resp. rate 17, height 5\' 2"  (1.575 m), weight 47.6 kg, SpO2 98 %.  PHYSICAL EXAMINATION:  GENERAL:  58 y.o.-year-old patient lying in the bed with no acute distress.  EYES: Pupils equal, round, reactive to light and accommodation. No scleral icterus. Extraocular muscles intact.  HEENT: Head atraumatic, normocephalic. Oropharynx and nasopharynx clear.  NECK:  Supple, no jugular venous distention. No thyroid enlargement, no tenderness.  LUNGS: Normal breath sounds bilaterally, no wheezing, rales,rhonchi or crepitation. No use of accessory muscles of respiration.  CARDIOVASCULAR: S1, S2 normal. No murmurs, rubs, or  gallops.  ABDOMEN: Soft, nontender, nondistended. Bowel sounds present. No organomegaly or mass.  EXTREMITIES: No pedal edema, cyanosis, or clubbing.  Has hand tremors NEUROLOGIC: Cranial nerves II through XII are intact. Muscle strength 5/5 in all extremities. Sensation intact. Gait not checked.  PSYCHIATRIC: The patient is alert and oriented x 3.  SKIN: No obvious rash, lesion, or ulcer.    LABORATORY PANEL:   CBC Recent Labs  Lab 07/14/18 0436  WBC 3.7*  HGB 9.7*  HCT 28.9*  PLT 47*   ------------------------------------------------------------------------------------------------------------------  Chemistries  Recent Labs  Lab 07/14/18 0436  NA 135  K 3.3*  CL 100  CO2 25  GLUCOSE 83  BUN <5*  CREATININE 0.47  CALCIUM 7.7*  MG 1.3*  AST 205*  ALT 75*  ALKPHOS 95  BILITOT 6.4*   ------------------------------------------------------------------------------------------------------------------  Cardiac Enzymes No results for input(s): TROPONINI in the last 168 hours. ------------------------------------------------------------------------------------------------------------------  RADIOLOGY:  Ct Head Wo Contrast  Result Date: 07/13/2018 CLINICAL DATA:  Head trauma. EXAM: CT HEAD WITHOUT CONTRAST TECHNIQUE: Contiguous axial images were obtained from the base of the skull through the vertex without intravenous contrast. COMPARISON:  None. FINDINGS: Brain: Mild atrophy. Negative for hydrocephalus. Negative for acute infarct, hemorrhage, or mass. Vascular: Negative for hyperdense vessel Skull: Negative for skull fracture.  Right parietal scalp contusion Sinuses/Orbits: Negative Other: None IMPRESSION: No acute abnormality.  Generalized atrophy. Electronically Signed   By: Marlan Palau M.D.   On: 07/13/2018 11:43   Ct Abdomen Pelvis W Contrast  Result Date: 07/13/2018 CLINICAL DATA:  Epigastric pain following drinking alcohol, initial encounter EXAM: CT ABDOMEN AND  PELVIS WITH CONTRAST TECHNIQUE: Multidetector CT imaging of the abdomen and pelvis  was performed using the standard protocol following bolus administration of intravenous contrast. CONTRAST:  33mL ISOVUE-300 IOPAMIDOL (ISOVUE-300) INJECTION 61% COMPARISON:  None. FINDINGS: Lower chest: No acute abnormality. Hepatobiliary: Diffuse fatty infiltration of the liver is noted. The gallbladder is well distended. Pancreas: Unremarkable. No pancreatic ductal dilatation or surrounding inflammatory changes. Spleen: Normal in size without focal abnormality. Adrenals/Urinary Tract: The adrenal glands are within normal limits. Kidneys demonstrate no renal calculi or obstructive changes. Normal excretion of contrast is noted on delayed images. Bladder is well distended. Stomach/Bowel: Scattered diverticular change of the colon is noted without evidence of diverticulitis. The appendix is not well visualized consistent with a prior surgical history. No small bowel obstructive or inflammatory changes are seen. A duodenal diverticulum is noted adjacent to the uncinate process of the pancreas. Stomach is decompressed. Small sliding-type hiatal hernia is noted. Some thickening of the distal esophagus is noted which may be related to reflux and mucosal edema although the possibility of an obstructing mass could not be totally excluded. Fluoroscopic evaluation may be helpful. Vascular/Lymphatic: Aortic atherosclerosis. No enlarged abdominal or pelvic lymph nodes. Reproductive: Uterus and bilateral adnexa are unremarkable. Other: No abdominal wall hernia or abnormality. No abdominopelvic ascites. Musculoskeletal: Degenerative changes of the lumbar spine are noted. IMPRESSION: Thickening in the distal esophagus which may be related to mucosal edema from reflux although the possibility of a filling defect could not be totally excluded. Fluoroscopic evaluation is recommended. Fatty infiltration of the liver. Chronic changes without acute  abnormality. Electronically Signed   By: Alcide Clever M.D.   On: 07/13/2018 11:46    EKG:   Orders placed or performed during the hospital encounter of 07/13/18  . EKG 12-Lead  . EKG 12-Lead    ASSESSMENT AND PLAN:   58 year old female patient with alcoholic pancreatitis: Lipase trending down, down from 3 17-1 33 today.  Start clear liquids.  Advised to stay away from alcohol. 2.  EtOH abuse: Continue CIWA protocol 3.  Multiple electrolyte abnormalities with hypokalemia, hypophosphatemia, hypomagnesemia, pharmacy consulted for electrolyte management, patient has severe hypophosphatemia, phosphorus level less than 1 this morning. 4.  Chronic pancytopenia with anemia, thrombocytopenia with platelets 47: Discontinue Lovenox. #5 alcoholic gastritis: Continue Protonix 40 mg p.o. daily. 6.  History of abdominal pain, nausea, vomiting, alcoholic pancreatitis, no abdominal pain now, continue IV fluids for today, decrease the rate to 50 mL/h.    All the records are reviewed and case discussed with Care Management/Social Workerr. Management plans discussed with the patient, family and they are in agreement.  CODE STATUS: Full code  TOTAL TIME TAKING CARE OF THIS PATIENT: 40 minutes.   POSSIBLE D/C IN 1-2 DAYS, DEPENDING ON CLINICAL CONDITION. No prior charts in epic. More than 50% time spent in counseling, coordination of care Katha Hamming M.D on 07/14/2018 at 11:24 AM  Between 7am to 6pm - Pager - [phone]  After 6pm go to [email or web address] - password [email or web address] [facility]  [place] [facility]  Office  [phone]  CC: Primary care physician; No primary care provider on file.   Note: This dictation was prepared with Dragon dictation along with smaller phrase technology. Any transcriptional errors that result from this process are unintentional.

## 2018-07-15 LAB — LIPASE, BLOOD: Lipase: 63 U/L — ABNORMAL HIGH (ref 11–51)

## 2018-07-15 LAB — COMPREHENSIVE METABOLIC PANEL
ALT: 80 U/L — AB (ref 0–44)
AST: 226 U/L — ABNORMAL HIGH (ref 15–41)
Albumin: 3.3 g/dL — ABNORMAL LOW (ref 3.5–5.0)
Alkaline Phosphatase: 97 U/L (ref 38–126)
Anion gap: 9 (ref 5–15)
CO2: 27 mmol/L (ref 22–32)
CREATININE: 0.36 mg/dL — AB (ref 0.44–1.00)
Calcium: 7.4 mg/dL — ABNORMAL LOW (ref 8.9–10.3)
Chloride: 96 mmol/L — ABNORMAL LOW (ref 98–111)
GFR calc Af Amer: >60 mL/min (ref >60)
GFR calc non Af Amer: >60 mL/min (ref >60)
GLUCOSE: 83 mg/dL (ref 70–99)
Potassium: 3.5 mmol/L (ref 3.5–5.1)
Sodium: 132 mmol/L — ABNORMAL LOW (ref 135–145)
Total Bilirubin: 4.6 mg/dL — ABNORMAL HIGH (ref 0.3–1.2)
Total Protein: 5.7 g/dL — ABNORMAL LOW (ref 6.5–8.1)

## 2018-07-15 LAB — PHOSPHORUS: Phosphorus: 2.9 mg/dL (ref 2.5–4.6)

## 2018-07-15 LAB — MAGNESIUM: Magnesium: 1.6 mg/dL — ABNORMAL LOW (ref 1.7–2.4)

## 2018-07-15 MED ORDER — SUCRALFATE 1 G PO TABS
1.0000 g | ORAL_TABLET | Freq: Three times a day (TID) | ORAL | Status: DC
  Administered 2018-07-15 – 2018-07-21 (×22): 1 g via ORAL
  Filled 2018-07-15 (×23): qty 1

## 2018-07-15 MED ORDER — ALUM & MAG HYDROXIDE-SIMETH 200-200-20 MG/5ML PO SUSP
30.0000 mL | Freq: Four times a day (QID) | ORAL | Status: DC | PRN
  Administered 2018-07-15: 30 mL via ORAL
  Filled 2018-07-15: qty 30

## 2018-07-15 NOTE — Progress Notes (Signed)
[facility] [place] at [facility]   PATIENT NAME: Kaitlyn MinionMarilyn Farrell    MR#:  [id number]  DATE OF BIRTH:  08/26/1960  SUBJECTIVE: Complains of epigastric pain, nausea.  Admitted for alcohol intoxication, acute pancreatitis.  Acute pancreatitis is improved, main complaint today is epigastric pain, nausea.  CIWA score is 2.  CHIEF COMPLAINT:   Chief Complaint  Patient presents with  . Alcohol Intoxication  . Chest Pain  CIWA score this morning was 10.  REVIEW OF SYSTEMS:   Review of Systems  Constitutional: Negative for chills and fever.  HENT: Negative for hearing loss.   Eyes: Negative for blurred vision, double vision and photophobia.  Respiratory: Negative for cough, hemoptysis and shortness of breath.   Cardiovascular: Negative for palpitations, orthopnea and leg swelling.  Gastrointestinal: Positive for heartburn and nausea. Negative for abdominal pain, diarrhea and vomiting.  Genitourinary: Negative for dysuria and urgency.  Musculoskeletal: Negative for myalgias and neck pain.  Skin: Negative for rash.  Neurological: Negative for dizziness, focal weakness, seizures, weakness and headaches.  Psychiatric/Behavioral: Negative for memory loss. The patient does not have insomnia.   Appears very anxious.   DRUG ALLERGIES:   Allergies  Allergen Reactions  . Hydrocodone Itching and Rash    VITALS:  Blood pressure 129/83, pulse 85, temperature 98.5 F (36.9 C), temperature source Oral, resp. rate 20, height 5\' 2"  (1.575 m), weight 47.6 kg, SpO2 100 %.  PHYSICAL EXAMINATION:  GENERAL:  58 y.o.-year-old patient lying in the bed with no acute distress.  EYES: Pupils equal, round, reactive to light and accommodation. No scleral icterus. Extraocular muscles intact.  HEENT: Head atraumatic, normocephalic. Oropharynx and nasopharynx clear.  NECK:  Supple, no jugular venous distention. No thyroid enlargement, no tenderness.  LUNGS: Normal breath sounds  bilaterally, no wheezing, rales,rhonchi or crepitation. No use of accessory muscles of respiration.  CARDIOVASCULAR: S1, S2 normal. No murmurs, rubs, or gallops.  ABDOMEN: Soft, nontender, nondistended. Bowel sounds present. No organomegaly or mass.  EXTREMITIES: No pedal edema, cyanosis, or clubbing.  Has hand tremors NEUROLOGIC: Cranial nerves II through XII are intact. Muscle strength 5/5 in all extremities. Sensation intact. Gait not checked.  PSYCHIATRIC: The patient is alert and oriented x 3.  SKIN: No obvious rash, lesion, or ulcer.    LABORATORY PANEL:   CBC Recent Labs  Lab 07/14/18 0436  WBC 3.7*  HGB 9.7*  HCT 28.9*  PLT 47*   ------------------------------------------------------------------------------------------------------------------  Chemistries  Recent Labs  Lab 07/15/18 0406  NA 132*  K 3.5  CL 96*  CO2 27  GLUCOSE 83  BUN <5*  CREATININE 0.36*  CALCIUM 7.4*  MG 1.6*  AST 226*  ALT 80*  ALKPHOS 97  BILITOT 4.6*   ------------------------------------------------------------------------------------------------------------------  Cardiac Enzymes No results for input(s): TROPONINI in the last 168 hours. ------------------------------------------------------------------------------------------------------------------  RADIOLOGY:  No results found.  EKG:   Orders placed or performed during the hospital encounter of 07/13/18  . EKG 12-Lead  . EKG 12-Lead    ASSESSMENT AND PLAN:   58 year old female patient with alcoholic pancreatitis:  lipase normalized, advance diet to regular diet    3.  Multiple electrolyte abnormalities with hypokalemia, hypophosphatemia, hypomagnesemia, pharmacy consulted for electrolyte management,  4.  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  Thrombocytopenia with platelets 47: Discontinue Lovenox.  Patient INR is 1.21, abdomen 3.3. #5 alcoholic gastritis: Switch to IV  Protonix, add Carafate. 6.  History of abdominal pain, nausea, vomiting,  alcoholic pancreatitis, no abdominal pain now, encourage p.o. intake.  Discontinue IV fluids. #7 .alcohol abuse, continue CIWA scale Out of bed to chair, physical therapy consult, possible discharge home tomorrow All the records are reviewed and case discussed with Care Management/Social Workerr. Management plans discussed with the patient, family and they are in agreement.  CODE STATUS: Full code  TOTAL TIME TAKING CARE OF THIS PATIENT: 40 minutes.   POSSIBLE D/C a.m. depending on clinical condition No prior charts in epic. More than 50% time spent in counseling, coordination of care Katha HammingSnehalatha Eldor Conaway M.D on 07/15/2018 at 11:37 AM  Between 7am to 6pm - Pager - [phone]  After 6pm go to [email or web address] - password [email or web address] [facility]  [facility]  Office  [phone]  CC: Primary care physician; No primary care provider on file.   Note: This dictation was prepared with Dragon dictation along with smaller phrase technology. Any transcriptional errors that result from this process are unintentional.

## 2018-07-15 NOTE — Consult Note (Signed)
PHARMACY CONSULT NOTE  Pharmacy Consult for Electrolyte Monitoring and Replacement   Recent Labs: Potassium (mmol/L)  Date Value  07/15/2018 3.5   Magnesium (mg/dL)  Date Value  33/29/5188 1.6 (L)   Calcium (mg/dL)  Date Value  41/66/0630 7.4 (L)   Albumin (g/dL)  Date Value  16/06/930 3.3 (L)   Phosphorus (mg/dL)  Date Value  35/57/3220 2.9   Sodium (mmol/L)  Date Value  07/15/2018 132 (L)   Corrected Ca: 8.0  Assessment: 58 y.o. female with h/o seizures not on any medications, significant alcohol abuse history presents to hospital secondary to abdominal pain nausea and vomiting. She is at significant risk for re-feeding syndrome, therefore pharmacy will monitor potassium, phosphorous and magnesium closely at least for the first 3 days. On admission her potassium was 3.0 and she received a total of 60 mEq of IV KCl. Since 0400 this morning she has been receiving NS20 at 100 mL/hr  Goal of Therapy:  Electrolytes wnl  Plan:  01/31 @ 0500 K 3.5, Phos 2.9 both WNL. No further replacement needed at this time, will monitor w/ am labs.  Thomasene Ripple ,PharmD Clinical Pharmacist 07/15/2018 5:45 AM

## 2018-07-16 LAB — RENAL FUNCTION PANEL
Albumin: 3.5 g/dL (ref 3.5–5.0)
Anion gap: 8 (ref 5–15)
BUN: <5 mg/dL — ABNORMAL LOW (ref 6–20)
CO2: 29 mmol/L (ref 22–32)
CREATININE: 0.37 mg/dL — AB (ref 0.44–1.00)
Calcium: 8.3 mg/dL — ABNORMAL LOW (ref 8.9–10.3)
Chloride: 93 mmol/L — ABNORMAL LOW (ref 98–111)
GFR calc Af Amer: >60 mL/min (ref >60)
GFR calc non Af Amer: >60 mL/min (ref >60)
Glucose, Bld: 84 mg/dL (ref 70–99)
Phosphorus: 1.6 mg/dL — ABNORMAL LOW (ref 2.5–4.6)
Potassium: 3.6 mmol/L (ref 3.5–5.1)
Sodium: 130 mmol/L — ABNORMAL LOW (ref 135–145)

## 2018-07-16 LAB — MAGNESIUM: Magnesium: 1.5 mg/dL — ABNORMAL LOW (ref 1.7–2.4)

## 2018-07-16 MED ORDER — SUCRALFATE 1 G PO TABS
1.0000 g | ORAL_TABLET | Freq: Three times a day (TID) | ORAL | Status: DC
  Administered 2018-07-16: 1 g via ORAL

## 2018-07-16 MED ORDER — PANTOPRAZOLE SODIUM 40 MG IV SOLR
40.0000 mg | Freq: Two times a day (BID) | INTRAVENOUS | Status: DC
  Administered 2018-07-16 – 2018-07-18 (×5): 40 mg via INTRAVENOUS
  Filled 2018-07-16 (×5): qty 40

## 2018-07-16 MED ORDER — POTASSIUM PHOSPHATES 15 MMOLE/5ML IV SOLN
15.0000 mmol | Freq: Once | INTRAVENOUS | Status: AC
  Administered 2018-07-16: 15 mmol via INTRAVENOUS
  Filled 2018-07-16: qty 5

## 2018-07-16 MED ORDER — MAGNESIUM SULFATE 2 GM/50ML IV SOLN
2.0000 g | Freq: Once | INTRAVENOUS | Status: AC
  Administered 2018-07-16: 2 g via INTRAVENOUS
  Filled 2018-07-16: qty 50

## 2018-07-16 NOTE — Progress Notes (Signed)
[facility] - [place] at [facility]   PATIENT NAME: Kaitlyn MinionMarilyn Farrell    MR#:  [id number]  DATE OF BIRTH:  09/02/1960  SUBJECTIVE: Patient still complains of nausea, heartburn and she is very uncomfortable.  CHIEF COMPLAINT:   Chief Complaint  Patient presents with  . Alcohol Intoxication  . Chest Pain  CIWA score this morning was 10.  REVIEW OF SYSTEMS:   Review of Systems  Constitutional: Negative for chills and fever.  HENT: Negative for hearing loss.   Eyes: Negative for blurred vision, double vision and photophobia.  Respiratory: Negative for cough, hemoptysis and shortness of breath.   Cardiovascular: Negative for palpitations, orthopnea and leg swelling.  Gastrointestinal: Positive for heartburn and nausea. Negative for abdominal pain, diarrhea and vomiting.  Genitourinary: Negative for dysuria and urgency.  Musculoskeletal: Negative for myalgias and neck pain.  Skin: Negative for rash.  Neurological: Negative for dizziness, focal weakness, seizures, weakness and headaches.  Psychiatric/Behavioral: Negative for memory loss. The patient does not have insomnia.   Appears very anxious.   DRUG ALLERGIES:   Allergies  Allergen Reactions  . Hydrocodone Itching and Rash    VITALS:  Blood pressure (!) 127/95, pulse 77, temperature 98.3 F (36.8 C), temperature source Oral, resp. rate 18, height 5\' 2"  (1.575 m), weight 47.6 kg, SpO2 100 %.  PHYSICAL EXAMINATION:  GENERAL:  58 y.o.-year-old patient lying in the bed with no acute distress.  EYES: Pupils equal, round, reactive to light and accommodation. No scleral icterus. Extraocular muscles intact.  HEENT: Head atraumatic, normocephalic. Oropharynx and nasopharynx clear.  NECK:  Supple, no jugular venous distention. No thyroid enlargement, no tenderness.  LUNGS: Normal breath sounds bilaterally, no wheezing, rales,rhonchi or crepitation. No use of accessory muscles of respiration.  CARDIOVASCULAR:  S1, S2 normal. No murmurs, rubs, or gallops.  ABDOMEN: Soft, nontender, nondistended. Bowel sounds present. No organomegaly or mass.  EXTREMITIES: No pedal edema, cyanosis, or clubbing.  Has hand tremors NEUROLOGIC: Cranial nerves II through XII are intact. Muscle strength 5/5 in all extremities. Sensation intact. Gait not checked.  PSYCHIATRIC: The patient is alert and oriented x 3.  SKIN: No obvious rash, lesion, or ulcer.    LABORATORY PANEL:   CBC Recent Labs  Lab 07/14/18 0436  WBC 3.7*  HGB 9.7*  HCT 28.9*  PLT 47*   ------------------------------------------------------------------------------------------------------------------  Chemistries  Recent Labs  Lab 07/15/18 0406 07/16/18 0457  NA 132* 130*  K 3.5 3.6  CL 96* 93*  CO2 27 29  GLUCOSE 83 84  BUN <5* <5*  CREATININE 0.36* 0.37*  CALCIUM 7.4* 8.3*  MG 1.6* 1.5*  AST 226*  --   ALT 80*  --   ALKPHOS 97  --   BILITOT 4.6*  --    ------------------------------------------------------------------------------------------------------------------  Cardiac Enzymes No results for input(s): TROPONINI in the last 168 hours. ------------------------------------------------------------------------------------------------------------------  RADIOLOGY:  No results found.  EKG:   Orders placed or performed during the hospital encounter of 07/13/18  . EKG 12-Lead  . EKG 12-Lead    ASSESSMENT AND PLAN:   58 year old female patient with alcoholic pancreatitis:  lipase normalized, advance diet to regular diet patient tolerating diet however has nausea, heartburn.   3.  Multiple electrolyte abnormalities with hypokalemia, hypophosphatemia, hypomagnesemia, pharmacy consulted for electrolyte management,, patient potassium is better but still has low magnesium.  4.  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  Thrombocytopenia with platelets 47: Discontinue Lovenox.   Patient INR  is 1.21,   #5 alcoholic gastritis: Started IV Protonix, Carafate and see if it helps.    6.  Alcoholic gastritis, patient is on CIWA protocol for alcohol withdrawal symptoms.   Out of bed to chair, physical therapy consult, able to discharge today because of persistent nausea, heartburn symptoms requiring IV Protonix. All the records are reviewed and case discussed with Care Management/Social Workerr. Management plans discussed with the patient, family and they are in agreement.  CODE STATUS: Full code  TOTAL TIME TAKING CARE OF THIS PATIENT: 40 minutes.   POSSIBLE D/C a.m. depending on clinical condition No prior charts in epic. More than 50% time spent in counseling, coordination of care Katha HammingSnehalatha Kaliyah Gladman M.D on 07/16/2018 at 11:21 AM  Between 7am to 6pm - Pager - [phone]  After 6pm go to [email or web address] - password [email or web address] [facility]  [facility]  Office  [phone]  CC: Primary care physician; No primary care provider on file.   Note: This dictation was prepared with Dragon dictation along with smaller phrase technology. Any transcriptional errors that result from this process are unintentional.

## 2018-07-16 NOTE — Consult Note (Addendum)
PHARMACY CONSULT NOTE  Pharmacy Consult for Electrolyte Monitoring and Replacement   Recent Labs: Potassium (mmol/L)  Date Value  07/16/2018 3.6   Magnesium (mg/dL)  Date Value  80/16/5537 1.5 (L)   Calcium (mg/dL)  Date Value  48/27/0786 8.3 (L)   Albumin (g/dL)  Date Value  75/44/9201 3.5   Phosphorus (mg/dL)  Date Value  00/71/2197 1.6 (L)   Sodium (mmol/L)  Date Value  07/16/2018 130 (L)   Corrected Ca: 8.5  Assessment: 58 y.o. female with h/o seizures not on any medications, significant alcohol abuse history presents to hospital secondary to abdominal pain nausea and vomiting. She is at significant risk for re-feeding syndrome, therefore pharmacy will monitor potassium, phosphorous and magnesium closely at least for the first 3 days.   Goal of Therapy:  Electrolytes wnl  Plan:  2/1: Phos: 1.6, K: 3.6, Mg: 1.5. Will replace with Magnesium 2g IV x 1 dose and KPhos IV x 1 dose.  Will recheck electrolytes with AM labs and continue to replace as needed.   Gardner Candle, PharmD, BCPS Clinical Pharmacist 07/16/2018 5:45 AM

## 2018-07-16 NOTE — Evaluation (Signed)
Physical Therapy Evaluation Patient Details Name: Kaitlyn Farrell MRN: [id number] DOB: Oct 15, 1960 Today's Date: 07/16/2018   History of Present Illness  58 y.o. female with h/o seizures not on any medications, significant alcohol abuse history presents to hospital secondary to abdominal pain nausea and vomiting.    Clinical Impression  Patient alert, complaining of mild nausea, moderate abdominal pain at start of session. Reported living in first floor handicapped apartment with disabled roommate, independent in gait/ADLs/IADLs prior to admission.  Pt mobility limited by nausea and abdominal pain, but demonstrated bed mobility mod I. Sit <> stand transfers without AD with supervision, but patient anxious and very eager to utilize RW. Pt ambulated ~4ft with RW and supervision/CGA. Gait very slow, shuffled step, intermittent cues for appropriate RW use.  Overall the patient demonstrated deficits (see "PT Problem List") that impede the patient's functional abilities, safety, and mobility and would benefit from skilled PT intervention. Recommendation is HHPT with supervision for mobility/OOB.      Follow Up Recommendations Home health PT;Supervision for mobility/OOB    Equipment Recommendations  Rolling walker with 5" wheels    Recommendations for Other Services       Precautions / Restrictions Precautions Precautions: Fall Restrictions Weight Bearing Restrictions: No      Mobility  Bed Mobility Overal bed mobility: Modified Independent                Transfers Overall transfer level: Modified independent Equipment used: None;Rolling walker (2 wheeled)             General transfer comment: pt able to perform without AD, strongly prefers to use RW, reaches for it.  Ambulation/Gait Ambulation/Gait assistance: Supervision Gait Distance (Feet): 45 Feet Assistive device: Rolling walker (2 wheeled)       General Gait Details: very decreased gait velocity, shuffling  step, very limited due to nausea symptoms, abdominal pain. intermittent verbal cues to manage AD.  Stairs            Wheelchair Mobility    Modified Rankin (Stroke Patients Only)       Balance Overall balance assessment: Needs assistance Sitting-balance support: Feet supported Sitting balance-Leahy Scale: Good       Standing balance-Leahy Scale: Fair Standing balance comment: Pt able to stand without UE support, but anxious. strongly preferred to use RW.                             Pertinent Vitals/Pain Pain Assessment: Faces Faces Pain Scale: Hurts even more Pain Location: Pt complained of abdominal pain/nausea throughout session, increased with mobility. Pain Descriptors / Indicators: Grimacing;Guarding;Moaning Pain Intervention(s): Limited activity within patient's tolerance;Repositioned;Monitored during session;Premedicated before session    Home Living Family/patient expects to be discharged to:: Private residence Living Arrangements: Non-relatives/Friends Available Help at Discharge: Family Type of Home: Apartment Home Access: Ramped entrance     Home Layout: One level Home Equipment: Environmental consultant - 2 wheels      Prior Function Level of Independence: Independent               Hand Dominance        Extremity/Trunk Assessment   Upper Extremity Assessment Upper Extremity Assessment: Overall WFL for tasks assessed    Lower Extremity Assessment Lower Extremity Assessment: RLE deficits/detail;Generalized weakness;LLE deficits/detail RLE Deficits / Details: grossly 4/5 LLE Deficits / Details: grossly 4/5    Cervical / Trunk Assessment Cervical / Trunk Assessment: Normal  Communication   Communication:  No difficulties  Cognition Arousal/Alertness: Awake/alert Behavior During Therapy: WFL for tasks assessed/performed Overall Cognitive Status: Within Functional Limits for tasks assessed                                         General Comments      Exercises     Assessment/Plan    PT Assessment Patient needs continued PT services  PT Problem List Decreased strength;Decreased activity tolerance;Decreased balance;Decreased mobility;Decreased knowledge of use of DME       PT Treatment Interventions DME instruction;Therapeutic exercise;Balance training;Gait training;Stair training;Neuromuscular re-education;Functional mobility training;Therapeutic activities;Patient/family education    PT Goals (Current goals can be found in the Care Plan section)  Acute Rehab PT Goals Patient Stated Goal: to decrease abdominal pain PT Goal Formulation: With patient Time For Goal Achievement: 07/30/18 Potential to Achieve Goals: Good    Frequency Min 2X/week   Barriers to discharge        Co-evaluation               AM-PAC PT "6 Clicks" Mobility  Outcome Measure Help needed turning from your back to your side while in a flat bed without using bedrails?: None Help needed moving from lying on your back to sitting on the side of a flat bed without using bedrails?: None Help needed moving to and from a bed to a chair (including a wheelchair)?: A Little Help needed standing up from a chair using your arms (e.g., wheelchair or bedside chair)?: A Little Help needed to walk in hospital room?: A Little Help needed climbing 3-5 steps with a railing? : A Lot 6 Click Score: 19    End of Session Equipment Utilized During Treatment: Gait belt Activity Tolerance: Patient limited by fatigue;Patient limited by pain Patient left: in chair;with chair alarm set;with call bell/phone within reach Nurse Communication: Mobility status PT Visit Diagnosis: Other abnormalities of gait and mobility (R26.89);Difficulty in walking, not elsewhere classified (R26.2)    Time: 0354-6568 PT Time Calculation (min) (ACUTE ONLY): 29 min   Charges:   PT Evaluation $PT Eval Low Complexity: 1 Low PT Treatments $Therapeutic Activity:  8-22 mins        Olga Coaster PT, DPT 2:39 PM,07/16/18 [phone]

## 2018-07-17 LAB — RENAL FUNCTION PANEL
Albumin: 3.3 g/dL — ABNORMAL LOW (ref 3.5–5.0)
Anion gap: 7 (ref 5–15)
BUN: <5 mg/dL — ABNORMAL LOW (ref 6–20)
CO2: 28 mmol/L (ref 22–32)
Calcium: 8.5 mg/dL — ABNORMAL LOW (ref 8.9–10.3)
Chloride: 96 mmol/L — ABNORMAL LOW (ref 98–111)
Creatinine, Ser: 0.42 mg/dL — ABNORMAL LOW (ref 0.44–1.00)
GFR calc Af Amer: >60 mL/min (ref >60)
GFR calc non Af Amer: >60 mL/min (ref >60)
Glucose, Bld: 113 mg/dL — ABNORMAL HIGH (ref 70–99)
Phosphorus: 2.1 mg/dL — ABNORMAL LOW (ref 2.5–4.6)
Potassium: 4.3 mmol/L (ref 3.5–5.1)
Sodium: 131 mmol/L — ABNORMAL LOW (ref 135–145)

## 2018-07-17 LAB — MAGNESIUM: Magnesium: 1.7 mg/dL (ref 1.7–2.4)

## 2018-07-17 MED ORDER — SODIUM PHOSPHATES 45 MMOLE/15ML IV SOLN
15.0000 mmol | Freq: Once | INTRAVENOUS | Status: AC
  Administered 2018-07-17: 15 mmol via INTRAVENOUS
  Filled 2018-07-17: qty 5

## 2018-07-17 MED ORDER — MECLIZINE HCL 25 MG PO TABS
25.0000 mg | ORAL_TABLET | Freq: Two times a day (BID) | ORAL | Status: DC | PRN
  Administered 2018-07-17: 25 mg via ORAL
  Filled 2018-07-17 (×2): qty 1

## 2018-07-17 MED ORDER — MAGNESIUM SULFATE 2 GM/50ML IV SOLN
2.0000 g | Freq: Once | INTRAVENOUS | Status: AC
  Administered 2018-07-17: 2 g via INTRAVENOUS
  Filled 2018-07-17: qty 50

## 2018-07-17 MED ORDER — SODIUM CHLORIDE 0.9 % IV SOLN
INTRAVENOUS | Status: DC | PRN
  Administered 2018-07-17 – 2018-07-18 (×2): 250 mL via INTRAVENOUS

## 2018-07-17 NOTE — Progress Notes (Signed)
While rounding, Dr. Luberta Mutter gave verbal orders for a regular diet and for meclizine 25mg  bid prn.

## 2018-07-17 NOTE — Progress Notes (Signed)
[facility] - [place] at [facility]   PATIENT NAME: Kaitlyn Farrell    MR#:  [id number]  DATE OF BIRTH:  1960-12-15  Patient heartburn, nausea better today.  Still has dizziness when she gets up.  CHIEF COMPLAINT:   Chief Complaint  Patient presents with  . Alcohol Intoxication  . Chest Pain  CIWA score this morning was 10.  REVIEW OF SYSTEMS:   Review of Systems  Constitutional: Negative for chills and fever.  HENT: Negative for hearing loss.   Eyes: Negative for blurred vision, double vision and photophobia.  Respiratory: Negative for cough, hemoptysis and shortness of breath.   Cardiovascular: Negative for palpitations, orthopnea and leg swelling.  Gastrointestinal: Positive for heartburn. Negative for abdominal pain, diarrhea, nausea and vomiting.  Genitourinary: Negative for dysuria and urgency.  Musculoskeletal: Negative for myalgias and neck pain.  Skin: Negative for rash.  Neurological: Positive for dizziness. Negative for focal weakness, seizures, weakness and headaches.  Psychiatric/Behavioral: Negative for memory loss. The patient does not have insomnia.   Appears very anxious.   DRUG ALLERGIES:   Allergies  Allergen Reactions  . Hydrocodone Itching and Rash    VITALS:  Blood pressure 109/73, pulse 79, temperature 98.3 F (36.8 C), temperature source Oral, resp. rate 20, height 5\' 2"  (1.575 m), weight 47.6 kg, SpO2 100 %.  PHYSICAL EXAMINATION:  GENERAL:  58 y.o.-year-old patient lying in the bed with no acute distress.  EYES: Pupils equal, round, reactive to light and accommodation. No scleral icterus. Extraocular muscles intact.  HEENT: Head atraumatic, normocephalic. Oropharynx and nasopharynx clear.  NECK:  Supple, no jugular venous distention. No thyroid enlargement, no tenderness.  LUNGS: Normal breath sounds bilaterally, no wheezing, rales,rhonchi or crepitation. No use of accessory muscles of respiration.  CARDIOVASCULAR:  S1, S2 normal. No murmurs, rubs, or gallops.  ABDOMEN: Soft, nontender, nondistended. Bowel sounds present. No organomegaly or mass.  EXTREMITIES: No pedal edema, cyanosis, or clubbing.  Has hand tremors NEUROLOGIC: Cranial nerves II through XII are intact. Muscle strength 5/5 in all extremities. Sensation intact. Gait not checked.  PSYCHIATRIC: The patient is alert and oriented x 3.  SKIN: No obvious rash, lesion, or ulcer.    LABORATORY PANEL:   CBC Recent Labs  Lab 07/14/18 0436  WBC 3.7*  HGB 9.7*  HCT 28.9*  PLT 47*   ------------------------------------------------------------------------------------------------------------------  Chemistries  Recent Labs  Lab 07/15/18 0406  07/17/18 0448  NA 132*   < > 131*  K 3.5   < > 4.3  CL 96*   < > 96*  CO2 27   < > 28  GLUCOSE 83   < > 113*  BUN <5*   < > <5*  CREATININE 0.36*   < > 0.42*  CALCIUM 7.4*   < > 8.5*  MG 1.6*   < > 1.7  AST 226*  --   --   ALT 80*  --   --   ALKPHOS 97  --   --   BILITOT 4.6*  --   --    < > = values in this interval not displayed.   ------------------------------------------------------------------------------------------------------------------  Cardiac Enzymes No results for input(s): TROPONINI in the last 168 hours. ------------------------------------------------------------------------------------------------------------------  RADIOLOGY:  No results found.  EKG:   Orders placed or performed during the hospital encounter of 07/13/18  . EKG 12-Lead  . EKG 12-Lead    ASSESSMENT AND PLAN:   58 year old female patient with alcoholic pancreatitis:  lipase normalized, started regular  diet, see how she does.  3.  Multiple electrolyte abnormalities with hypokalemia, hypophosphatemia, hypomagnesemia, pharmacy consulted for electrolyte management,, patient potassium is better but still has low magnesium.  Continue to replace.  4.  Alcoholic liver cirrhosis with chronic pancytopenia  with anemia,no previous labs in epic.  Thrombocytopenia with platelets 47: Discontinue Lovenox.  Patient INR is 1.21,   #5 alcoholic gastritis: Proving with IV Protonix, Carafate, plan to continue today, switch to orals tomorrow for possible discharge tomorrow.  6.  Alcoholic gastritis, patient is on CIWA protocol for alcohol withdrawal symptoms. CIWA score is around 2.  #7 dizziness, possible positional vertigo due to recent ear infection: Continue monitor, ordered meclizine. All the records are reviewed and case discussed with Care Management/Social Workerr. Management plans discussed with the patient, family and they are in agreement.  CODE STATUS: Full code  TOTAL TIME TAKING CARE OF THIS PATIENT: 40 minutes.   POSSIBLE D/C a.m. depending on clinical condition No prior charts in epic. More than 50% time spent in counseling, coordination of care Katha Hamming M.D on 07/17/2018 at 11:14 AM  Between 7am to 6pm - Pager - [phone]  After 6pm go to [email or web address] - password [email or web address] [facility]  [place] [facility]  Office  [phone]  CC: Primary care physician; No primary care provider on file.   Note: This dictation was prepared with Dragon dictation along with smaller phrase technology. Any transcriptional errors that result from this process are unintentional.

## 2018-07-17 NOTE — Consult Note (Signed)
PHARMACY CONSULT NOTE  Pharmacy Consult for Electrolyte Monitoring and Replacement   Recent Labs: Potassium (mmol/L)  Date Value  07/17/2018 4.3   Magnesium (mg/dL)  Date Value  62/26/3335 1.7   Calcium (mg/dL)  Date Value  45/62/5638 8.5 (L)   Albumin (g/dL)  Date Value  93/73/4287 3.3 (L)   Phosphorus (mg/dL)  Date Value  68/04/5725 2.1 (L)   Sodium (mmol/L)  Date Value  07/17/2018 131 (L)   Corrected Ca: 8.5  Assessment: 58 y.o. female with h/o seizures not on any medications, significant alcohol abuse history presents to hospital secondary to abdominal pain nausea and vomiting. She is at significant risk for re-feeding syndrome, therefore pharmacy will monitor potassium, phosphorous and magnesium closely at least for the first 3 days.   Goal of Therapy:  Electrolytes wnl  Plan:  2/2: Phos: 2.1, K: 4.3, Mg: 1.7. Will replace with Magnesium 2g IV x 1 dose and Sodium Phos IV x 1 dose.  Will recheck electrolytes with AM labs and continue to replace as needed.   Gardner Candle, PharmD, BCPS Clinical Pharmacist 07/17/2018 5:33 AM

## 2018-07-18 LAB — RENAL FUNCTION PANEL
Albumin: 3.5 g/dL (ref 3.5–5.0)
Anion gap: 6 (ref 5–15)
BUN: 5 mg/dL — ABNORMAL LOW (ref 6–20)
CHLORIDE: 99 mmol/L (ref 98–111)
CO2: 29 mmol/L (ref 22–32)
CREATININE: 0.46 mg/dL (ref 0.44–1.00)
Calcium: 8.9 mg/dL (ref 8.9–10.3)
GFR calc Af Amer: >60 mL/min (ref >60)
GFR calc non Af Amer: >60 mL/min (ref >60)
Glucose, Bld: 79 mg/dL (ref 70–99)
Phosphorus: 3.3 mg/dL (ref 2.5–4.6)
Potassium: 4 mmol/L (ref 3.5–5.1)
Sodium: 134 mmol/L — ABNORMAL LOW (ref 135–145)

## 2018-07-18 LAB — MAGNESIUM: Magnesium: 1.8 mg/dL (ref 1.7–2.4)

## 2018-07-18 MED ORDER — PANTOPRAZOLE SODIUM 40 MG PO TBEC
40.0000 mg | DELAYED_RELEASE_TABLET | Freq: Two times a day (BID) | ORAL | Status: DC
  Administered 2018-07-18 – 2018-07-21 (×6): 40 mg via ORAL
  Filled 2018-07-18 (×6): qty 1

## 2018-07-18 MED ORDER — MAGNESIUM SULFATE 2 GM/50ML IV SOLN
2.0000 g | Freq: Once | INTRAVENOUS | Status: AC
  Administered 2018-07-18: 2 g via INTRAVENOUS
  Filled 2018-07-18: qty 50

## 2018-07-18 NOTE — Consult Note (Signed)
PHARMACY CONSULT NOTE  Pharmacy Consult for Electrolyte Monitoring and Replacement   Recent Labs: Potassium (mmol/L)  Date Value  07/18/2018 4.0   Magnesium (mg/dL)  Date Value  16/06/930 1.8   Calcium (mg/dL)  Date Value  35/57/3220 8.9   Albumin (g/dL)  Date Value  25/42/7062 3.5   Phosphorus (mg/dL)  Date Value  37/62/8315 3.3   Sodium (mmol/L)  Date Value  07/18/2018 134 (L)   Corrected Ca: 8.5  Assessment: 58 y.o. female with h/o seizures not on any medications, significant alcohol abuse history presents to hospital secondary to abdominal pain nausea and vomiting. She is at significant risk for re-feeding syndrome, therefore pharmacy will monitor potassium, phosphorous and magnesium closely at least for the first 3 days.   Goal of Therapy:  Electrolytes wnl  Plan:  2/2: Phos: 3.3, K: 4.0, Mg: 1.8. Will replace with Magnesium 2g IV x 1 dose.  No additional replacement needed at this time.   Will recheck electrolytes with AM labs and continue to replace as needed.   Gardner Candle, PharmD, BCPS Clinical Pharmacist 07/18/2018 5:38 AM

## 2018-07-18 NOTE — Progress Notes (Signed)
[facility] Physicians - [place] at [facility]   PATIENT NAME: Kaitlyn Farrell    MR#:  [id number]  DATE OF BIRTH:  April 25, 1961   Heartburn, better than yesterday.  Has dizziness, some nausea.  CHIEF COMPLAINT:   Chief Complaint  Patient presents with  . Alcohol Intoxication  . Chest Pain  CIWA score this morning was 10.  REVIEW OF SYSTEMS:   Review of Systems  Constitutional: Negative for chills and fever.  HENT: Negative for hearing loss.   Eyes: Negative for blurred vision, double vision and photophobia.  Respiratory: Negative for cough, hemoptysis and shortness of breath.   Cardiovascular: Negative for palpitations, orthopnea and leg swelling.  Gastrointestinal: Positive for heartburn. Negative for abdominal pain, diarrhea, nausea and vomiting.  Genitourinary: Negative for dysuria and urgency.  Musculoskeletal: Negative for myalgias and neck pain.  Skin: Negative for rash.  Neurological: Positive for dizziness. Negative for focal weakness, seizures, weakness and headaches.  Psychiatric/Behavioral: Negative for memory loss. The patient does not have insomnia.   Appears very anxious.   DRUG ALLERGIES:   Allergies  Allergen Reactions  . Hydrocodone Itching and Rash    VITALS:  Blood pressure 93/70, pulse 80, temperature 98.7 F (37.1 C), temperature source Oral, resp. rate 16, height 5\' 2"  (1.575 m), weight 47.6 kg, SpO2 99 %.  PHYSICAL EXAMINATION:  GENERAL:  58 y.o.-year-old patient lying in the bed with no acute distress.  EYES: Pupils equal, round, reactive to light and accommodation. No scleral icterus. Extraocular muscles intact.  HEENT: Head atraumatic, normocephalic. Oropharynx and nasopharynx clear.  NECK:  Supple, no jugular venous distention. No thyroid enlargement, no tenderness.  LUNGS: Normal breath sounds bilaterally, no wheezing, rales,rhonchi or crepitation. No use of accessory muscles of respiration.  CARDIOVASCULAR: S1, S2 normal. No  murmurs, rubs, or gallops.  ABDOMEN: Soft, nontender, nondistended. Bowel sounds present. No organomegaly or mass.  EXTREMITIES: No pedal edema, cyanosis, or clubbing.  Has hand tremors NEUROLOGIC: Cranial nerves II through XII are intact. Muscle strength 5/5 in all extremities. Sensation intact. Gait not checked.  PSYCHIATRIC: The patient is alert and oriented x 3.  SKIN: No obvious rash, lesion, or ulcer.    LABORATORY PANEL:   CBC Recent Labs  Lab 07/14/18 0436  WBC 3.7*  HGB 9.7*  HCT 28.9*  PLT 47*   ------------------------------------------------------------------------------------------------------------------  Chemistries  Recent Labs  Lab 07/15/18 0406  07/18/18 0414  NA 132*   < > 134*  K 3.5   < > 4.0  CL 96*   < > 99  CO2 27   < > 29  GLUCOSE 83   < > 79  BUN <5*   < > 5*  CREATININE 0.36*   < > 0.46  CALCIUM 7.4*   < > 8.9  MG 1.6*   < > 1.8  AST 226*  --   --   ALT 80*  --   --   ALKPHOS 97  --   --   BILITOT 4.6*  --   --    < > = values in this interval not displayed.   ------------------------------------------------------------------------------------------------------------------  Cardiac Enzymes No results for input(s): TROPONINI in the last 168 hours. ------------------------------------------------------------------------------------------------------------------  RADIOLOGY:  No results found.  EKG:   Orders placed or performed during the hospital encounter of 07/13/18  . EKG 12-Lead  . EKG 12-Lead    ASSESSMENT AND PLAN:   58 year old female patient with alcoholic pancreatitis:  lipase normalized, tolerating regular diet, likely discharge  tomorrow.  Discontinued IV fluids.  3.  Multiple electrolyte abnormalities with hypokalemia, hypophosphatemia, hypomagnesemia, pharmacy consulted for electrolyte management Hypomagnesemia, continue to replace.   4.  Alcoholic liver cirrhosis with chronic pancytopenia with anemia,no previous  labs in epic.  Thrombocytopenia with platelets 47: Discontinue Lovenox.  Patient INR is 1.21,   #5 alcoholic gastritis: Switch to p.o. Protonix, continue Carafate. 6.  Alcoholic gastritis, patient finished withdrawal symptoms.  CT abdomen on admission showed fatty liver, patient has evidence of thrombocytopenia, elevated INR.  CT abdomen on admission showed distal esophagus mucosal edema, unable to exclude filling defect, will order barium swallow.  #7 dizziness, possible positional vertigo due to recent ear infection: Continue monitor, ordered meclizine.  Patient wants scheduled meclizine.   out of bed to chair, ambulate as tolerated in preparation for discharge a.m. All the records are reviewed and case discussed with Care Management/Social Workerr. Management plans discussed with the patient, family and they are in agreement.  CODE STATUS: Full code  TOTAL TIME TAKING CARE OF THIS PATIENT: 40 minutes.    No prior charts in epic.  Katha Hamming M.D on 07/18/2018 at 12:06 PM  Between 7am to 6pm - Pager - [phone]  After 6pm go to [email or web address] - password [email or web address] [facility]  [facility]  Office  [phone]  CC: Primary care physician; No primary care provider on file.   Note: This dictation was prepared with Dragon dictation along with smaller phrase technology. Any transcriptional errors that result from this process are unintentional.

## 2018-07-19 ENCOUNTER — Inpatient Hospital Stay: Payer: Self-pay

## 2018-07-19 LAB — BASIC METABOLIC PANEL
Anion gap: 7 (ref 5–15)
BUN: 6 mg/dL (ref 6–20)
CO2: 28 mmol/L (ref 22–32)
CREATININE: 0.53 mg/dL (ref 0.44–1.00)
Calcium: 9.4 mg/dL (ref 8.9–10.3)
Chloride: 100 mmol/L (ref 98–111)
GFR calc Af Amer: >60 mL/min (ref >60)
GFR calc non Af Amer: >60 mL/min (ref >60)
Glucose, Bld: 86 mg/dL (ref 70–99)
Potassium: 3.8 mmol/L (ref 3.5–5.1)
Sodium: 135 mmol/L (ref 135–145)

## 2018-07-19 LAB — TROPONIN I: Troponin I: <0.03 ng/mL (ref <0.03)

## 2018-07-19 LAB — MAGNESIUM: Magnesium: 1.8 mg/dL (ref 1.7–2.4)

## 2018-07-19 LAB — PHOSPHORUS: Phosphorus: 3.9 mg/dL (ref 2.5–4.6)

## 2018-07-19 NOTE — Consult Note (Signed)
Cephas Darby, MD [address]  [place]  [place], [place] [address]  Main: [phone]  Fax: [phone] Pager: [phone]   Consultation  Referring Provider:     No ref. provider found Primary Care Physician:  No primary care provider on file. Primary Gastroenterologist: None         Reason for Consultation:     Esophageal stricture, dysphagia  Date of Admission:  07/13/2018 Date of Consultation:  07/19/2018         HPI:   Kaitlyn Farrell is a 58 y.o. female with ETOH abuse, ETOH liver disease admitted on 1/29 with ETOH pancreatitis which has currently resolved. She was c/o substernal chest tightness and reported past h/o esophageal stricture. She had CT which showed narrowing of distal esophagus and confirmed by barium swallow today, with difficult passage of barium tablet. Pt reports known esophageal stricture 15years ago, stretched at that time. She was asymptomatic until 5 years ago, noticed difficulty swallowing meat unless she chews well. She can swallow liquids soft foods without any difficulty. She had the stricture stretched 5 years ago, she says it was stretched too much that caused severe pain and had to got to ER right away. She did not see GI recently. She is tolerating liquids and soft food in the hospital but has severe chest tightness. On protonix 30m BID     NSAIDs: none  Antiplts/Anticoagulants/Anti thrombotics: none  GI Procedures: EGD in the past  Past Medical History:  Diagnosis Date  . Seizures ([place]     Past Surgical History:  Procedure Laterality Date  . APPENDECTOMY    . TONSILLECTOMY      Prior to Admission medications   Medication Sig Start Date End Date Taking? Authorizing Provider  esomeprazole (NEXIUM) 20 MG capsule Take 20 mg by mouth daily at 12 noon.   Yes [provider]  traZODone (DESYREL) 100 MG tablet Take 100 mg by mouth at bedtime.   Yes [provider]    Family History  Family history  unknown: Yes     Social History   Tobacco Use  . Smoking status: Never Smoker  . Smokeless tobacco: Never Used  Substance Use Topics  . Alcohol use: Yes    Comment: 1/2 gallon of liquor/day  . Drug use: Never    Allergies as of 07/13/2018 - Review Complete 07/13/2018  Allergen Reaction Noted  . Hydrocodone Itching and Rash 07/13/2018    Review of Systems:    All systems reviewed and negative except where noted in HPI.   Physical Exam:  Vital signs in last 24 hours: Temp:  [98.3 F (36.8 C)-98.5 F (36.9 C)] 98.3 F (36.8 C) (02/04 1213) Pulse Rate:  [70-78] 78 (02/04 1213) Resp:  [18] 18 (02/04 1213) BP: (98-121)/(68-79) 102/75 (02/04 1213) SpO2:  [100 %] 100 % (02/04 1213) Last BM Date: 07/17/18 General:   Pleasant, cooperative in NAD Head:  Normocephalic and atraumatic. Eyes:   No icterus.   Conjunctiva pink. PERRLA. Ears:  Normal auditory acuity. Neck:  Supple; no masses or thyroidomegaly Lungs: Respirations even and unlabored. Lungs clear to auscultation bilaterally.   No wheezes, crackles, or rhonchi.  Heart:  Regular rate and rhythm;  Without murmur, clicks, rubs or gallops Abdomen:  Soft, nondistended, epigastric tenderness. Normal bowel sounds. No appreciable masses or hepatomegaly.  No rebound or guarding.  Rectal:  Not performed. Msk:  Symmetrical without gross deformities.  Strength normal  Extremities:  Without edema, cyanosis  or clubbing. Neurologic:  Alert and oriented x3;  grossly normal neurologically. Skin:  Intact without significant lesions or rashes. Cervical Nodes:  No significant cervical adenopathy. Psych:  Alert and cooperative. Normal affect.  LAB RESULTS: CBC Latest Ref Rng & Units 07/14/2018 07/13/2018  WBC 4.0 - 10.5 K/uL 3.7(L) 5.6  Hemoglobin 12.0 - 15.0 g/dL 9.7(L) 11.6(L)  Hematocrit 36.0 - 46.0 % 28.9(L) 33.6(L)  Platelets 150 - 400 K/uL 47(L) 87(L)    BMET BMP Latest Ref Rng & Units 07/19/2018 07/18/2018 07/17/2018  Glucose 70 - 99  mg/dL 86 79 113(H)  BUN 6 - 20 mg/dL 6 5(L) <5(L)  Creatinine 0.44 - 1.00 mg/dL 0.53 0.46 0.42(L)  Sodium 135 - 145 mmol/L 135 134(L) 131(L)  Potassium 3.5 - 5.1 mmol/L 3.8 4.0 4.3  Chloride 98 - 111 mmol/L 100 99 96(L)  CO2 22 - 32 mmol/L _0 Calcium 8.9 - 10.3 mg/dL 9.4 8.9 8.5(L)    LFT Hepatic Function Latest Ref Rng & Units 07/18/2018 07/17/2018 07/16/2018  Total Protein 6.5 - 8.1 g/dL - - -  Albumin 3.5 - 5.0 g/dL 3.5 3.3(L) 3.5  AST 15 - 41 U/L - - -  ALT 0 - 44 U/L - - -  Alk Phosphatase 38 - 126 U/L - - -  Total Bilirubin 0.3 - 1.2 mg/dL - - -     STUDIES: Dg Esophagus W Double Cm (hd)  Result Date: 07/19/2018 CLINICAL DATA:  Pain and dysphagia. Reported previous stretching procedures EXAM: ESOPHOGRAM / BARIUM SWALLOW / BARIUM TABLET STUDY TECHNIQUE: Combined double contrast and single contrast examination performed using thick barium liquid and thin barium liquid. The patient was observed with fluoroscopy swallowing a 13 mm barium sulphate tablet. FLUOROSCOPY TIME:  Fluoroscopy Time: 2 minutes 42 seconds Radiation Exposure Index (if provided by the fluoroscopic device): 29.10 mGy Number of Acquired Spot Images: 21 COMPARISON:  None. FINDINGS: The patient swallows without difficulty. Esophageal peristalsis is within normal limits. There is no appreciable hiatal hernia. There is smooth narrowing of the distal 4 cm of the esophagus. No more focal stricture evident. Esophageal contour is otherwise normal. No esophageal mass or ulceration. The pharynx appears normal. A 13 mm barium tablet passed freely to the level of the distal esophageal narrowing. The tablet remained in this area for approximately 15 minutes before partially dissolving and passing into the stomach. IMPRESSION: Moderate smooth narrowing involving the distal 4 cm of the esophagus. A 13 mm barium tablet would not pass through this area intact. The area of narrowing at its maximum measures approximately 9 mm. No other  evident esophageal stricture. No mass or ulceration. Mucosa of the esophagus appears unremarkable. Peristalsis overall appears unremarkable. Electronically Signed   By: Lowella Grip III M.D.   On: 07/19/2018 08:56      Impression / Plan:   Kaitlyn Farrell is a 58 y.o. female with ETOH abuse, alcoholic liver disease with pancytopenia, ETOH induced acute pancreatitis, clinically improving, GI consulted for dysphagia and distal esophageal narrowing.   Dysphagia NPO past midnight Tentative plan for EGD tomorrow with possible dilation Platelets < 50, transfuse to maintain platelets > 50 for esophageal dilation due to high risk for bleeding with therapeutic intervention Check CBC in AM, and transfuse platelets if needed Abstinence from ETOH PPI BID indefinitely  Epigastric pain Recommend repeat CT to evaluate for pancreatic fluid collections in setting of acute panvreatitis  Thank you for involving me in the care of this patient. Will follow  along with you    LOS: 6 days   Sherri Sear, MD  07/19/2018, 6:51 PM   Note: This dictation was prepared with Dragon dictation along with smaller phrase technology. Any transcriptional errors that result from this process are unintentional.

## 2018-07-19 NOTE — Progress Notes (Signed)
PT Cancellation Note  Patient Details Name: Kaitlyn Farrell MRN: [id number] DOB: 22-Jul-1960   Cancelled Treatment:    Reason Eval/Treat Not Completed: Patient at procedure or test/unavailable   Pt out of room when attempted.  Will continue as appropriate.   Danielle Dess 07/19/2018, 8:57 AM

## 2018-07-19 NOTE — Progress Notes (Signed)
Physical Therapy Treatment Patient Details Name: Oneida ArenasMarilyn M Matto MRN: [id number] DOB: 02/03/1961 Today's Date: 07/19/2018    History of Present Illness 58 y.o. female with h/o seizures not on any medications, significant alcohol abuse history presents to hospital secondary to abdominal pain nausea and vomiting.    PT Comments    Pt with independent bed mobility, transfers and gait on unit x 2 laps with no AD.  Stated she is walking in room and to bathroom with no AD or assist.  Reports no incidents and overall comfortable with mobility skills.  She voices no concerns with mobility.  Adjusted recommendations to reflect improved mobility.   Follow Up Recommendations  No PT follow up     Equipment Recommendations       Recommendations for Other Services       Precautions / Restrictions Precautions Precautions: Fall Restrictions Weight Bearing Restrictions: No    Mobility  Bed Mobility Overal bed mobility: Modified Independent;Independent                Transfers Overall transfer level: Independent                  Ambulation/Gait Ambulation/Gait assistance: Independent Gait Distance (Feet): 350 Feet Assistive device: None       General Gait Details: steady with no LOB   Stairs             Wheelchair Mobility    Modified Rankin (Stroke Patients Only)       Balance Overall balance assessment: Independent                                          Cognition Arousal/Alertness: Awake/alert Behavior During Therapy: WFL for tasks assessed/performed Overall Cognitive Status: Within Functional Limits for tasks assessed                                        Exercises      General Comments        Pertinent Vitals/Pain Pain Assessment: No/denies pain    Home Living                      Prior Function            PT Goals (current goals can now be found in the care plan section) Progress  towards PT goals: Progressing toward goals    Frequency    Min 2X/week      PT Plan Discharge plan needs to be updated    Co-evaluation              AM-PAC PT "6 Clicks" Mobility   Outcome Measure  Help needed turning from your back to your side while in a flat bed without using bedrails?: None Help needed moving from lying on your back to sitting on the side of a flat bed without using bedrails?: None Help needed moving to and from a bed to a chair (including a wheelchair)?: None Help needed standing up from a chair using your arms (e.g., wheelchair or bedside chair)?: None Help needed to walk in hospital room?: None Help needed climbing 3-5 steps with a railing? : A Little 6 Click Score: 23    End of Session Equipment Utilized During Treatment: Gait belt Activity Tolerance: Patient  tolerated treatment well Patient left: in bed;with call bell/phone within reach         Time: 1110-1118 PT Time Calculation (min) (ACUTE ONLY): 8 min  Charges:  $Gait Training: 8-22 mins                     Danielle DessSarah Landyn Buckalew, PTA 07/19/18, 11:22 AM

## 2018-07-19 NOTE — Progress Notes (Signed)
[facility] Physicians - [place] at [facility]   PATIENT NAME: Kaitlyn Farrell    MR#:  [id number]  DATE OF BIRTH:  1961-04-07  Barium swallow showed distal esophagus narrowing, scheduled for EGD tomorrow morning.  Spoke with Dr. Allegra Lai.  Explained to the patient about the findings of barium swallow.  She told me today she is feeling like this for few months.  Had history of esophageal stricture status post narrowing 15 years ago.  CHIEF COMPLAINT:   Chief Complaint  Patient presents with  . Alcohol Intoxication  . Chest Pain  CIWA score this morning was 10.  REVIEW OF SYSTEMS:   Review of Systems  Constitutional: Negative for chills and fever.  HENT: Negative for hearing loss.   Eyes: Negative for blurred vision, double vision and photophobia.  Respiratory: Negative for cough, hemoptysis and shortness of breath.   Cardiovascular: Negative for palpitations, orthopnea and leg swelling.  Gastrointestinal: Positive for heartburn. Negative for abdominal pain, diarrhea, nausea and vomiting.  Genitourinary: Negative for dysuria and urgency.  Musculoskeletal: Negative for myalgias and neck pain.  Skin: Negative for rash.  Neurological: Negative for dizziness, focal weakness, seizures, weakness and headaches.  Psychiatric/Behavioral: Negative for memory loss. The patient does not have insomnia.   Appears very anxious.   DRUG ALLERGIES:   Allergies  Allergen Reactions  . Hydrocodone Itching and Rash    VITALS:  Blood pressure 121/79, pulse 70, temperature 98.5 F (36.9 C), temperature source Oral, resp. rate 18, height 5\' 2"  (1.575 m), weight 47.6 kg, SpO2 100 %.  PHYSICAL EXAMINATION:  GENERAL:  58 y.o.-year-old patient lying in the bed with no acute distress.  EYES: Pupils equal, round, reactive to light and accommodation. No scleral icterus. Extraocular muscles intact.  HEENT: Head atraumatic, normocephalic. Oropharynx and nasopharynx clear.  NECK:  Supple, no  jugular venous distention. No thyroid enlargement, no tenderness.  LUNGS: Normal breath sounds bilaterally, no wheezing, rales,rhonchi or crepitation. No use of accessory muscles of respiration.  CARDIOVASCULAR: S1, S2 normal. No murmurs, rubs, or gallops.  ABDOMEN: Soft, nontender, nondistended. Bowel sounds present. No organomegaly or mass.  EXTREMITIES: No pedal edema, cyanosis, or clubbing.  Has hand tremors NEUROLOGIC: Cranial nerves II through XII are intact. Muscle strength 5/5 in all extremities. Sensation intact. Gait not checked.  PSYCHIATRIC: The patient is alert and oriented x 3.  SKIN: No obvious rash, lesion, or ulcer.    LABORATORY PANEL:   CBC Recent Labs  Lab 07/14/18 0436  WBC 3.7*  HGB 9.7*  HCT 28.9*  PLT 47*   ------------------------------------------------------------------------------------------------------------------  Chemistries  Recent Labs  Lab 07/15/18 0406  07/19/18 0520  NA 132*   < > 135  K 3.5   < > 3.8  CL 96*   < > 100  CO2 27   < > 28  GLUCOSE 83   < > 86  BUN <5*   < > 6  CREATININE 0.36*   < > 0.53  CALCIUM 7.4*   < > 9.4  MG 1.6*   < > 1.8  AST 226*  --   --   ALT 80*  --   --   ALKPHOS 97  --   --   BILITOT 4.6*  --   --    < > = values in this interval not displayed.   ------------------------------------------------------------------------------------------------------------------  Cardiac Enzymes Recent Labs  Lab 07/19/18 1012  TROPONINI <0.03   ------------------------------------------------------------------------------------------------------------------  RADIOLOGY:  Dg Esophagus W Double Cm (hd)  Result Date: 07/19/2018 CLINICAL DATA:  Pain and dysphagia. Reported previous stretching procedures EXAM: ESOPHOGRAM / BARIUM SWALLOW / BARIUM TABLET STUDY TECHNIQUE: Combined double contrast and single contrast examination performed using thick barium liquid and thin barium liquid. The patient was observed with  fluoroscopy swallowing a 13 mm barium sulphate tablet. FLUOROSCOPY TIME:  Fluoroscopy Time: 2 minutes 42 seconds Radiation Exposure Index (if provided by the fluoroscopic device): 29.10 mGy Number of Acquired Spot Images: 21 COMPARISON:  None. FINDINGS: The patient swallows without difficulty. Esophageal peristalsis is within normal limits. There is no appreciable hiatal hernia. There is smooth narrowing of the distal 4 cm of the esophagus. No more focal stricture evident. Esophageal contour is otherwise normal. No esophageal mass or ulceration. The pharynx appears normal. A 13 mm barium tablet passed freely to the level of the distal esophageal narrowing. The tablet remained in this area for approximately 15 minutes before partially dissolving and passing into the stomach. IMPRESSION: Moderate smooth narrowing involving the distal 4 cm of the esophagus. A 13 mm barium tablet would not pass through this area intact. The area of narrowing at its maximum measures approximately 9 mm. No other evident esophageal stricture. No mass or ulceration. Mucosa of the esophagus appears unremarkable. Peristalsis overall appears unremarkable. Electronically Signed   By: Bretta BangWilliam  Woodruff III M.D.   On: 07/19/2018 08:56    EKG:   Orders placed or performed during the hospital encounter of 07/13/18  . EKG 12-Lead  . EKG 12-Lead  . EKG 12-Lead  . EKG 12-Lead    ASSESSMENT AND PLAN:   58 year old female patient with alcoholic pancreatitis:  lipase normalized, tolerating regular diet, n.p.o. after midnight for EGD tomorrow.  3.  Multiple electrolyte abnormalities with hypokalemia, hypophosphatemia, hypomagnesemia, pharmacy consulted for electrolyte management Hypomagnesemia, continue to replace.   4.  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  Thrombocytopenia with platelets 47: Discontinue Lovenox.  Patient INR is 1.21,   #5 alcoholic gastritis: Patient barium  swallow showed esophageal narrowing, patient will be scheduled for EGD, spoke with Dr. Allegra LaiVanga, GI consult placed, discussed the barium swallow results with the patient.  Continue IV PPI, n.p.o. after midnight. 6.  Alcoholic gastritis, on IV PPI.,  Alcohol abuse patient received CIWA protocol, no further withdrawal symptoms  #7 ;dizziness, continue meclizine, patient had recent ear infection before she came to hospital.  Denies any fullness in the ears now.   Likely discharge tomorrow pending EGD results. All the records are reviewed and case discussed with Care Management/Social Workerr. Management plans discussed with the patient, family and they are in agreement.  CODE STATUS: Full code  TOTAL TIME TAKING CARE OF THIS PATIENT: 40 minutes.   More than 50% time spent in counseling, coordination of care No prior charts in epic.  Katha HammingSnehalatha Evarose Altland M.D on 07/19/2018 at 10:57 AM  Between 7am to 6pm - Pager - [phone]  After 6pm go to [email or web address] - password [email or web address] [facility]  [facility]  Office  [phone]  CC: Primary care physician; No primary care provider on file.   Note: This dictation was prepared with Dragon dictation along with smaller phrase technology. Any transcriptional errors that result from this process are unintentional.

## 2018-07-19 NOTE — Consult Note (Signed)
PHARMACY CONSULT NOTE  Pharmacy Consult for Electrolyte Monitoring and Replacement   Recent Labs: Potassium (mmol/L)  Date Value  07/19/2018 3.8   Magnesium (mg/dL)  Date Value  76/81/1572 1.8   Calcium (mg/dL)  Date Value  62/08/5595 9.4   Albumin (g/dL)  Date Value  41/63/8453 3.5   Phosphorus (mg/dL)  Date Value  64/68/0321 3.9   Sodium (mmol/L)  Date Value  07/19/2018 135   Corrected Ca: 8.5  Assessment: 58 y.o. female with h/o seizures not on any medications, significant alcohol abuse history presents to hospital secondary to abdominal pain nausea and vomiting. She is at significant risk for re-feeding syndrome, therefore pharmacy will monitor potassium, phosphorous and magnesium closely at least for the first 3 days.   Goal of Therapy:  Electrolytes wnl  Plan:  No additional replacement needed at this time.   Pharmacy will sign off for now.  Lowella Bandy, PharmD Clinical Pharmacist 07/19/2018 12:22 PM

## 2018-07-20 ENCOUNTER — Inpatient Hospital Stay: Payer: Self-pay | Admitting: Anesthesiology

## 2018-07-20 ENCOUNTER — Encounter
Admission: EM | Disposition: A | Discharge status: Home / Self Care | Payer: Self-pay | Source: Home / Self Care | Attending: Internal Medicine

## 2018-07-20 ENCOUNTER — Encounter: Payer: Self-pay | Admitting: Emergency Medicine

## 2018-07-20 DIAGNOSIS — K222 Esophageal obstruction: Secondary | ICD-10-CM

## 2018-07-20 DIAGNOSIS — R1310 Dysphagia, unspecified: Secondary | ICD-10-CM

## 2018-07-20 HISTORY — PX: ESOPHAGOGASTRODUODENOSCOPY: SHX5428

## 2018-07-20 LAB — CBC
HCT: 31.3 % — ABNORMAL LOW (ref 36.0–46.0)
Hemoglobin: 10.3 g/dL — ABNORMAL LOW (ref 12.0–15.0)
MCH: 32 pg (ref 26.0–34.0)
MCHC: 32.9 g/dL (ref 30.0–36.0)
MCV: 97.2 fL (ref 80.0–100.0)
Platelets: 162 10*3/uL (ref 150–400)
RBC: 3.22 MIL/uL — ABNORMAL LOW (ref 3.87–5.11)
RDW: 16 % — ABNORMAL HIGH (ref 11.5–15.5)
WBC: 4.5 10*3/uL (ref 4.0–10.5)
nRBC: 0 % (ref 0.0–0.2)

## 2018-07-20 SURGERY — EGD (ESOPHAGOGASTRODUODENOSCOPY)
Anesthesia: General

## 2018-07-20 MED ORDER — PROPOFOL 10 MG/ML IV BOLUS
INTRAVENOUS | Status: DC | PRN
  Administered 2018-07-20: 100 mg via INTRAVENOUS

## 2018-07-20 MED ORDER — LIDOCAINE 2% (20 MG/ML) 5 ML SYRINGE
INTRAMUSCULAR | Status: DC | PRN
  Administered 2018-07-20: 100 mg via INTRAVENOUS

## 2018-07-20 MED ORDER — PANTOPRAZOLE SODIUM 40 MG PO TBEC: 40.0000 mg | DELAYED_RELEASE_TABLET | Freq: Two times a day (BID) | ORAL | 0 refills | Status: DC

## 2018-07-20 MED ORDER — THIAMINE HCL 100 MG PO TABS: 100.0000 mg | ORAL_TABLET | Freq: Every day | ORAL | 0 refills | Status: DC

## 2018-07-20 MED ORDER — PHENYLEPHRINE HCL 10 MG/ML IJ SOLN
INTRAMUSCULAR | Status: DC | PRN
  Administered 2018-07-20 (×3): 100 ug via INTRAVENOUS

## 2018-07-20 MED ORDER — PROPOFOL 500 MG/50ML IV EMUL
INTRAVENOUS | Status: DC | PRN
  Administered 2018-07-20: 200 ug/kg/min via INTRAVENOUS

## 2018-07-20 MED ORDER — FOLIC ACID 1 MG PO TABS: 1.0000 mg | ORAL_TABLET | Freq: Every day | ORAL | 0 refills | Status: DC

## 2018-07-20 MED ORDER — SODIUM CHLORIDE 0.9 % IV SOLN
INTRAVENOUS | Status: DC
  Administered 2018-07-20: 1000 mL via INTRAVENOUS

## 2018-07-20 MED ORDER — GLYCOPYRROLATE 0.2 MG/ML IJ SOLN
INTRAMUSCULAR | Status: DC | PRN
  Administered 2018-07-20: 0.2 mg via INTRAVENOUS

## 2018-07-20 MED ORDER — PROPOFOL 10 MG/ML IV BOLUS
INTRAVENOUS | Status: AC
  Filled 2018-07-20: qty 20

## 2018-07-20 NOTE — Anesthesia Preprocedure Evaluation (Addendum)
Anesthesia Evaluation  Patient identified by MRN, date of birth, ID band Patient awake    Reviewed: Allergy & Precautions, H&P , NPO status , Patient's Chart, lab work & pertinent test results  Airway Mallampati: III       Dental  (+) Teeth Intact   Pulmonary neg pulmonary ROS, neg COPD, neg recent URI,           Cardiovascular negative cardio ROS       Neuro/Psych Seizures -,  negative psych ROS   GI/Hepatic GERD  ,(+)     substance abuse  alcohol use, H/o alcoholic liver disease, alcoholic pancreatitis   Endo/Other  negative endocrine ROS  Renal/GU negative Renal ROS  negative genitourinary   Musculoskeletal   Abdominal   Peds  Hematology negative hematology ROS (+)   Anesthesia Other Findings Past Medical History: No date: Seizures (HCC)  Past Surgical History: No date: APPENDECTOMY No date: TONSILLECTOMY  BMI    Body Mass Index:  19.20 kg/m      Reproductive/Obstetrics negative OB ROS                            Anesthesia Physical Anesthesia Plan  ASA: III  Anesthesia Plan: General   Post-op Pain Management:    Induction:   PONV Risk Score and Plan: Propofol infusion and TIVA  Airway Management Planned: Natural Airway  Additional Equipment:   Intra-op Plan:   Post-operative Plan:   Informed Consent: I have reviewed the patients History and Physical, chart, labs and discussed the procedure including the risks, benefits and alternatives for the proposed anesthesia with the patient or authorized representative who has indicated his/her understanding and acceptance.     Dental Advisory Given  Plan Discussed with: Anesthesiologist and CRNA  Anesthesia Plan Comments:         Anesthesia Quick Evaluation

## 2018-07-20 NOTE — Op Note (Signed)
[facility] Gastroenterology Patient Name: Kaitlyn Farrell Procedure Date: 07/20/2018 10:31 AM MRN: [id number] Account #: [id number] Date of Birth: Jul 31, 1960 Admit Type: Inpatient Age: 58 Room: [facility] ENDO ROOM 1 Gender: Female Note Status: Finalized Procedure:            Upper GI endoscopy Indications:          Esophageal dysphagia, Suspected stenosis of the                        esophagus, For therapy of esophageal stenosis Providers:            Lin Landsman MD, MD Referring MD:         No Local Md, MD (Referring MD) Medicines:            Monitored Anesthesia Care Complications:        No immediate complications. Estimated blood loss:                        Minimal. Procedure:            Pre-Anesthesia Assessment:                       - Prior to the procedure, a History and Physical was                        performed, and patient medications and allergies were                        reviewed. The patient is competent. The risks and                        benefits of the procedure and the sedation options and                        risks were discussed with the patient. All questions                        were answered and informed consent was obtained.                        Patient identification and proposed procedure were                        verified by the physician, the nurse, the                        anesthesiologist, the anesthetist and the technician in                        the pre-procedure area in the procedure room in the                        endoscopy suite. Mental Status Examination: alert and                        oriented. Airway Examination: normal oropharyngeal                        airway and neck mobility. Respiratory Examination:  clear to auscultation. CV Examination: normal.                        Prophylactic Antibiotics: The patient does not require                        prophylactic antibiotics.  Prior Anticoagulants: The                        patient has taken no previous anticoagulant or                        antiplatelet agents. ASA Grade Assessment: III - A                        patient with severe systemic disease. After reviewing                        the risks and benefits, the patient was deemed in                        satisfactory condition to undergo the procedure. The                        anesthesia plan was to use monitored anesthesia care                        (MAC). Immediately prior to administration of                        medications, the patient was re-assessed for adequacy                        to receive sedatives. The heart rate, respiratory rate,                        oxygen saturations, blood pressure, adequacy of                        pulmonary ventilation, and response to care were                        monitored throughout the procedure. The physical status                        of the patient was re-assessed after the procedure.                       After obtaining informed consent, the endoscope was                        passed under direct vision. Throughout the procedure,                        the patient's blood pressure, pulse, and oxygen                        saturations were monitored continuously. The Endoscope  was introduced through the mouth, and advanced to the                        second part of duodenum. The upper GI endoscopy was                        accomplished without difficulty. The patient tolerated                        the procedure fairly well. Findings:      The duodenal bulb and second portion of the duodenum were normal.      Mild portal hypertensive gastropathy was found in the gastric body.       Biopsies were taken with a cold forceps for Helicobacter pylori testing.      A 1 cm hiatal hernia was present.      Esophagogastric landmarks were identified: the gastroesophageal  junction       was found at 34 cm from the incisors.      One benign-appearing, intrinsic moderate (circumferential scarring or       stenosis; an endoscope may pass) stenosis was found 34 cm from the       incisors. This stenosis measured less than one cm (in length). The       stenosis was traversed. A TTS dilator was passed through the scope.       Dilation with a 15-16.5-18 mm balloon dilator was performed to 18 mm.       The dilation site was examined following endoscope reinsertion and       showed complete resolution of luminal narrowing, superficial tear with       oozing of blood. Estimated blood loss was minimal.      The examined esophagus was normal. Impression:           - Normal duodenal bulb and second portion of the                        duodenum.                       - Portal hypertensive gastropathy.                       - 1 cm hiatal hernia.                       - Esophagogastric landmarks identified.                       - Benign-appearing esophageal stenosis. Dilated to 97m.                       - No specimens collected. Recommendation:       - Return patient to hospital ward for ongoing care.                       - Full liquid diet today, advance tomorrow.                       - Continue present medications.                       - Use Protonix (pantoprazole) 40 mg PO BID  for 6 months.                       - Follow up in my clinic in 4 weeks.                       - Abstinance from ETOH Procedure Code(s):    --- Professional ---                       [phone], Esophagogastroduodenoscopy, flexible, transoral;                        with transendoscopic balloon dilation of esophagus                        (less than 30 mm diameter)                       43239, 59, Esophagogastroduodenoscopy, flexible,                        transoral; with biopsy, single or multiple Diagnosis Code(s):    --- Professional ---                       K76.6, Portal hypertension                        K44.9, Diaphragmatic hernia without obstruction or                        gangrene                       K31.89, Other diseases of stomach and duodenum                       K22.2, Esophageal obstruction                       R13.14, Dysphagia, pharyngoesophageal phase CPT copyright 2018 [employer]. All rights reserved. The codes documented in this report are preliminary and upon coder review may  be revised to meet current compliance requirements. Dr. Ulyess Mort Lin Landsman MD, MD 07/20/2018 11:07:04 AM This report has been signed electronically. Number of Addenda: 0 Note Initiated On: 07/20/2018 10:31 AM      [facility]

## 2018-07-20 NOTE — Anesthesia Post-op Follow-up Note (Signed)
Anesthesia QCDR form completed.        

## 2018-07-20 NOTE — Transfer of Care (Signed)
Immediate Anesthesia Transfer of Care Note  Patient: Kaitlyn Farrell  Procedure(s) Performed: ESOPHAGOGASTRODUODENOSCOPY (EGD) (N/A )  Patient Location: Endoscopy Unit  Anesthesia Type:General  Level of Consciousness: awake and alert   Airway & Oxygen Therapy: Patient connected to nasal cannula oxygen  Post-op Assessment: Post -op Vital signs reviewed and stable  Post vital signs: stable  Last Vitals:  Vitals Value Taken Time  BP 85/56 07/20/2018 11:00 AM  Temp 36.1 C 07/20/2018 11:00 AM  Pulse 81 07/20/2018 11:10 AM  Resp 14 07/20/2018 11:10 AM  SpO2 100 % 07/20/2018 11:10 AM  Vitals shown include unvalidated device data.  Last Pain:  Vitals:   07/20/18 1100  TempSrc: Tympanic  PainSc:       Patients Stated Pain Goal: 0 (07/20/18 0351)  Complications: No apparent anesthesia complications

## 2018-07-20 NOTE — Progress Notes (Signed)
[facility] [place] [facility]   PATIENT NAME: Kaitlyn Farrell    MR#:  [id number]  DATE OF BIRTH:  15-Oct-1960  Esophageal stricture dilated.  CHIEF COMPLAINT:   Chief Complaint  Patient presents with  . Alcohol Intoxication  . Chest Pain  CIWA score this morning was 10.  REVIEW OF SYSTEMS:   Review of Systems  Constitutional: Negative for chills and fever.  HENT: Negative for hearing loss.   Eyes: Negative for blurred vision, double vision and photophobia.  Respiratory: Negative for cough, hemoptysis and shortness of breath.   Cardiovascular: Negative for palpitations, orthopnea and leg swelling.  Gastrointestinal: Positive for heartburn. Negative for abdominal pain, diarrhea, nausea and vomiting.  Genitourinary: Negative for dysuria and urgency.  Musculoskeletal: Negative for myalgias and neck pain.  Skin: Negative for rash.  Neurological: Negative for dizziness, focal weakness, seizures, weakness and headaches.  Psychiatric/Behavioral: Negative for memory loss. The patient does not have insomnia.   Appears very anxious.   DRUG ALLERGIES:   Allergies  Allergen Reactions  . Hydrocodone Itching and Rash    VITALS:  Blood pressure (!) 88/56, pulse 69, temperature 98.8 F (37.1 C), temperature source Oral, resp. rate 16, height 5\' 2"  (1.575 m), weight 47.6 kg, SpO2 100 %.  PHYSICAL EXAMINATION:  GENERAL:  58 y.o.-year-old patient lying in the bed with no acute distress.  EYES: Pupils equal, round, reactive to light and accommodation. No scleral icterus. Extraocular muscles intact.  HEENT: Head atraumatic, normocephalic. Oropharynx and nasopharynx clear.  NECK:  Supple, no jugular venous distention. No thyroid enlargement, no tenderness.  LUNGS: Normal breath sounds bilaterally, no wheezing, rales,rhonchi or crepitation. No use of accessory muscles of respiration.  CARDIOVASCULAR: S1, S2 normal. No murmurs, rubs, or gallops.  ABDOMEN: Soft,  nontender, nondistended. Bowel sounds present. No organomegaly or mass.  EXTREMITIES: No pedal edema, cyanosis, or clubbing.  Has hand tremors NEUROLOGIC: Cranial nerves II through XII are intact. Muscle strength 5/5 in all extremities. Sensation intact. Gait not checked.  PSYCHIATRIC: The patient is alert and oriented x 3.  SKIN: No obvious rash, lesion, or ulcer.    LABORATORY PANEL:   CBC Recent Labs  Lab 07/20/18 0402  WBC 4.5  HGB 10.3*  HCT 31.3*  PLT 162   ------------------------------------------------------------------------------------------------------------------  Chemistries  Recent Labs  Lab 07/15/18 0406  07/19/18 0520  NA 132*   < > 135  K 3.5   < > 3.8  CL 96*   < > 100  CO2 27   < > 28  GLUCOSE 83   < > 86  BUN <5*   < > 6  CREATININE 0.36*   < > 0.53  CALCIUM 7.4*   < > 9.4  MG 1.6*   < > 1.8  AST 226*  --   --   ALT 80*  --   --   ALKPHOS 97  --   --   BILITOT 4.6*  --   --    < > = values in this interval not displayed.   ------------------------------------------------------------------------------------------------------------------  Cardiac Enzymes Recent Labs  Lab 07/19/18 1012  TROPONINI <0.03   ------------------------------------------------------------------------------------------------------------------  RADIOLOGY:  Dg Esophagus W Double Cm (hd)  Result Date: 07/19/2018 CLINICAL DATA:  Pain and dysphagia. Reported previous stretching procedures EXAM: ESOPHOGRAM / BARIUM SWALLOW / BARIUM TABLET STUDY TECHNIQUE: Combined double contrast and single contrast examination performed using thick barium liquid and thin barium liquid. The patient was observed with fluoroscopy swallowing a  13 mm barium sulphate tablet. FLUOROSCOPY TIME:  Fluoroscopy Time: 2 minutes 42 seconds Radiation Exposure Index (if provided by the fluoroscopic device): 29.10 mGy Number of Acquired Spot Images: 21 COMPARISON:  None. FINDINGS: The patient swallows without  difficulty. Esophageal peristalsis is within normal limits. There is no appreciable hiatal hernia. There is smooth narrowing of the distal 4 cm of the esophagus. No more focal stricture evident. Esophageal contour is otherwise normal. No esophageal mass or ulceration. The pharynx appears normal. A 13 mm barium tablet passed freely to the level of the distal esophageal narrowing. The tablet remained in this area for approximately 15 minutes before partially dissolving and passing into the stomach. IMPRESSION: Moderate smooth narrowing involving the distal 4 cm of the esophagus. A 13 mm barium tablet would not pass through this area intact. The area of narrowing at its maximum measures approximately 9 mm. No other evident esophageal stricture. No mass or ulceration. Mucosa of the esophagus appears unremarkable. Peristalsis overall appears unremarkable. Electronically Signed   By: Bretta Bang III M.D.   On: 07/19/2018 08:56    EKG:   Orders placed or performed during the hospital encounter of 07/13/18  . EKG 12-Lead  . EKG 12-Lead  . EKG 12-Lead  . EKG 12-Lead    ASSESSMENT AND PLAN:   58 year old female patient with alcoholic pancreatitis: Resolved.    2 esophageal stricture status is post EGD, post dilation, portal hypertensive gastropathy, hiatal hernia, esophageal stricture dilated, GI recommends full liquid diet, patient found to have superficial tear of esophagus during stricture dilatation, GI recommends observation today, discharge tomorrow.  Patient wants to stay today and discharge home tomorrow.  Continue full liquid diet.  Advance to full diet tomorrow.  3.  Multiple electrolyte abnormalities ; pharmacy consulted for electrolyte management 4.  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   #5 alcoholic gastritis:   6.  Alcoholic gastritis, on IV PPI.,  Alcohol abuse patient received CIWA protocol, no further withdrawal symptoms  #7  ;dizziness, continue meclizine, patient had recent ear infection before she came to hospital.  Denies any fullness in the ears now.   Likely discharge tomorrow pending EGD results. All the records are reviewed and case discussed with Care Management/Social Workerr. Management plans discussed with the patient, family and they are in agreement.  CODE STATUS: Full code  TOTAL TIME TAKING CARE OF THIS PATIENT: 40 minutes.   More than 50% time spent in counseling, coordination of care No prior charts in epic.  Katha Hamming M.D on 07/20/2018 at 2:10 PM  Between 7am to 6pm - Pager - [phone]  After 6pm go to [email or web address] - password [email or web address] [facility]  [facility]  Office  [phone]  CC: Primary care physician; No primary care provider on file.   Note: This dictation was prepared with Dragon dictation along with smaller phrase technology. Any transcriptional errors that result from this process are unintentional.

## 2018-07-20 NOTE — Progress Notes (Signed)
PT Cancellation Note  Patient Details Name: Kaitlyn Farrell MRN: [id number] DOB: 1961/03/26   Cancelled Treatment:    Reason Eval/Treat Not Completed: Patient at procedure or test/unavailable   Pt out of room.  Will continue as appropriate.   Danielle Dess 07/20/2018, 10:33 AM

## 2018-07-21 LAB — SURGICAL PATHOLOGY

## 2018-07-21 MED ORDER — NADOLOL 20 MG PO TABS: 10.0000 mg | ORAL_TABLET | Freq: Every day | ORAL | 0 refills | Status: DC

## 2018-07-21 MED ORDER — PROPRANOLOL HCL ER 60 MG PO CP24: 30.0000 mg | ORAL_CAPSULE | Freq: Every day | ORAL | 0 refills | Status: AC

## 2018-07-21 MED ORDER — THIAMINE HCL 100 MG PO TABS: 100.0000 mg | ORAL_TABLET | Freq: Every day | ORAL | 0 refills | Status: AC

## 2018-07-21 MED ORDER — FOLIC ACID 1 MG PO TABS: 1.0000 mg | ORAL_TABLET | Freq: Every day | ORAL | 0 refills | Status: AC

## 2018-07-21 MED ORDER — TRAZODONE HCL 100 MG PO TABS: 100.0000 mg | ORAL_TABLET | Freq: Every day | ORAL | 0 refills | Status: AC

## 2018-07-21 NOTE — Progress Notes (Signed)
Patient discharge teaching given, including activity, diet, follow-up appoints, and medications. Patient verbalized understanding of all discharge instructions. IV access was d/c'd. Vitals are stable. Skin is intact except as charted in most recent assessments. Pt to be escorted out by volunteer, to be driven home by family.  Tyniah Kastens  

## 2018-07-21 NOTE — Discharge Instructions (Signed)
Alcohol Use Disorder  Alcohol use disorder is when your drinking disrupts your daily life. When you have this condition, you drink too much alcohol and you cannot control your drinking.  Alcohol use disorder can cause serious problems with your physical health. It can affect your brain, heart, liver, pancreas, immune system, stomach, and intestines. Alcohol use disorder can increase your risk for certain cancers and cause problems with your mental health, such as depression, anxiety, psychosis, delirium, and dementia. People with this disorder risk hurting themselves and others.  What are the causes?  This condition is caused by drinking too much alcohol over time. It is not caused by drinking too much alcohol only one or two times. Some people with this condition drink alcohol to cope with or escape from negative life events. Others drink to relieve pain or symptoms of mental illness.  What increases the risk?  You are more likely to develop this condition if:  · You have a family history of alcohol use disorder.  · Your culture encourages drinking to the point of intoxication, or makes alcohol easy to get.  · You had a mood or conduct disorder in childhood.  · You have been a victim of abuse.  · You are an adolescent and:  ? You have poor grades or difficulties in school.  ? Your caregivers do not talk to you about saying no to alcohol, or supervise your activities.  ? You are impulsive or you have trouble with self-control.  What are the signs or symptoms?  Symptoms of this condition include:  · Drinking more than you want to.  · Drinking for longer than you want to.  · Trying several times to drink less or to control your drinking.  · Spending a lot of time getting alcohol, drinking, or recovering from drinking.  · Craving alcohol.  · Having problems at work, at school, or at home due to drinking.  · Having problems in relationships due to drinking.  · Drinking when it is dangerous to drink, such as before  driving a car.  · Continuing to drink even though you know you might have a physical or mental problem related to drinking.  · Needing more and more alcohol to get the same effect you want from the alcohol (building up tolerance).  · Having symptoms of withdrawal when you stop drinking. Symptoms of withdrawal include:  ? Fatigue.  ? Nightmares.  ? Trouble sleeping.  ? Depression.  ? Anxiety.  ? Fever.  ? Seizures.  ? Severe confusion.  ? Feeling or seeing things that are not there (hallucinations).  ? Tremors.  ? Rapid heart rate.  ? Rapid breathing.  ? High blood pressure.  · Drinking to avoid symptoms of withdrawal.  How is this diagnosed?  This condition is diagnosed with an assessment. Your health care provider may start the assessment by asking three or four questions about your drinking.  Your health care provider may perform a physical exam or do lab tests to see if you have physical problems resulting from alcohol use. She or he may refer you to a mental health professional for evaluation.  How is this treated?  Some people with alcohol use disorder are able to reduce their alcohol use to low-risk levels. Others need to completely quit drinking alcohol. When necessary, mental health professionals with specialized training in substance use treatment can help. Your health care provider can help you decide how severe your alcohol use disorder is and what type   of treatment you need. The following forms of treatment are available:  · Detoxification. Detoxification involves quitting drinking and using prescription medicines within the first week to help lessen withdrawal symptoms. This treatment is important for people who have had withdrawal symptoms before and for heavy drinkers who are likely to have withdrawal symptoms. Alcohol withdrawal can be dangerous, and in severe cases, it can cause death. Detoxification may be provided in a home, community, or primary care setting, or in a hospital or substance use  treatment facility.  · Counseling. This treatment is also called talk therapy. It is provided by substance use treatment counselors. A counselor can address the reasons you use alcohol and suggest ways to keep you from drinking again or to prevent problem drinking. The goals of talk therapy are to:  ? Find healthy activities and ways for you to cope with stress.  ? Identify and avoid the things that trigger your alcohol use.  ? Help you learn how to handle cravings.  · Medicines. Medicines can help treat alcohol use disorder by:  ? Decreasing alcohol cravings.  ? Decreasing the positive feeling you have when you drink alcohol.  ? Causing an uncomfortable physical reaction when you drink alcohol (aversion therapy).  · Support groups. Support groups are led by people who have quit drinking. They provide emotional support, advice, and guidance.  These forms of treatment are often combined. Some people with this condition benefit from a combination of treatments provided by specialized substance use treatment centers.  Follow these instructions at home:  · Take over-the-counter and prescription medicines only as told by your health care provider.  · Check with your health care provider before starting any new medicines.  · Ask friends and family members not to offer you alcohol.  · Avoid situations where alcohol is served, including gatherings where others are drinking alcohol.  · Create a plan for what to do when you are tempted to use alcohol.  · Find hobbies or activities that you enjoy that do not include alcohol.  · Keep all follow-up visits as told by your health care provider. This is important.  How is this prevented?  · If you drink, limit alcohol intake to no more than 1 drink a day for nonpregnant women and 2 drinks a day for men. One drink equals 12 oz of beer, 5 oz of wine, or 1½ oz of hard liquor.  · If you have a mental health condition, get treatment and support.  · Do not give alcohol to  adolescents.  · If you are an adolescent:  ? Do not drink alcohol.  ? Do not be afraid to say no if someone offers you alcohol. Speak up about why you do not want to drink. You can be a positive role model for your friends and set a good example for those around you by not drinking alcohol.  ? If your friends drink, spend time with others who do not drink alcohol. Make new friends who do not use alcohol.  ? Find healthy ways to manage stress and emotions, such as meditation or deep breathing, exercise, spending time in nature, listening to music, or talking with a trusted friend or family member.  Contact a health care provider if:  · You are not able to take your medicines as told.  · Your symptoms get worse.  · You return to drinking alcohol (relapse) and your symptoms get worse.  Get help right away if:  · You have thoughts   about hurting yourself or others.  If you ever feel like you may hurt yourself or others, or have thoughts about taking your own life, get help right away. You can go to your nearest emergency department or call:  · Your local emergency services (911 in the [place]  · A suicide crisis helpline, such as the National Suicide Prevention Lifeline at 1-800-273-8255. This is open 24 hours a day.  Summary  · Alcohol use disorder is when your drinking disrupts your daily life. When you have this condition, you drink too much alcohol and you cannot control your drinking.  · Treatment may include detoxification, counseling, medicine, and support groups.  · Ask friends and family members not to offer you alcohol. Avoid situations where alcohol is served.  · Get help right away if you have thoughts about hurting yourself or others.  This information is not intended to replace advice given to you by your health care provider. Make sure you discuss any questions you have with your health care provider.  Document Released: 07/09/2004 Document Revised: 02/27/2016 Document Reviewed: 02/27/2016  Elsevier  Interactive Patient Education © 2019 [employer]

## 2018-07-21 NOTE — Care Management Note (Signed)
Case Management Note  Patient Details  Name: NADIYAH TRAMMELL MRN: [id number] Date of Birth: [birth year]   Patient discharged home today.  Patient states that she lives at home with a room mate.  Her roommate had "medical issues" and she is his caregiver.  Patient states that she is non employed, however does receive food stamps.  PT has assessed patient and does not recommend any follow up.  Patient states that she does not have a have a PCP.  Patient denies issues with transportation. Patient provided with application to [facility] .  All prescriptions will be filled at [facility]  prescriptions were faxed. Patient to pick up at no cost after discharge.  Thiamine to be picked up over the counter.   Subjective/Objective:                    Action/Plan:   Expected Discharge Date:  07/21/18               Expected Discharge Plan:  Home/Self Care  In-House Referral:     Discharge planning Services  CM Consult, [facility], Medication Assistance  Post Acute Care Choice:    Choice offered to:     DME Arranged:    DME Agency:     HH Arranged:    HH Agency:     Status of Service:  Completed, signed off  If discussed at [employer], dates discussed:    Additional Comments:  Chapman Fitch, RN 07/21/2018, 2:14 PM

## 2018-07-21 NOTE — Anesthesia Postprocedure Evaluation (Signed)
Anesthesia Post Note  Patient: Kaitlyn Farrell  Procedure(s) Performed: ESOPHAGOGASTRODUODENOSCOPY (EGD) (N/A )  Patient location during evaluation: PACU Anesthesia Type: General Level of consciousness: awake and alert Pain management: pain level controlled Vital Signs Assessment: post-procedure vital signs reviewed and stable Respiratory status: spontaneous breathing, nonlabored ventilation and respiratory function stable Cardiovascular status: blood pressure returned to baseline and stable Postop Assessment: no apparent nausea or vomiting Anesthetic complications: no     Last Vitals:  Vitals:   07/21/18 0540 07/21/18 0800  BP: 121/76 114/80  Pulse: 63 66  Resp: 16 18  Temp: 37 C 37.1 C  SpO2: 100% 99%    Last Pain:  Vitals:   07/21/18 0800  TempSrc: Oral  PainSc: 0-No pain                 Jovita Gamma

## 2018-07-21 NOTE — Progress Notes (Addendum)
Discharge home, instructions are in the computer, needs help with medicines from case management.  Follow-up with gastroenterology Dr. Allegra Lai in 4 weeks.  Advised to quit alcohol.  Will start low-dose nadolol for portal hypertension.  Patient feels much better after esophageal stricture dilatation, does not have any discomfort in the esophagus .

## 2018-07-25 NOTE — Discharge Summary (Signed)
Kaitlyn Farrell, is a 58 y.o. female  DOB May 11, 1961  MRN [id number].  Admission date:  07/13/2018  Admitting Physician  Enid Baas, MD  Discharge Date:  07/21/2018   Primary MD  No primary care provider on file.  Recommendations for primary care physician for things to follow:   [facility] Follow-up with gastroenterology in 4 weeks  Admission Diagnosis  Dehydration [E86.0] Alcoholic ketoacidosis [E87.2] Alcohol withdrawal syndrome without complication (HCC) [F10.230]   Discharge Diagnosis  Dehydration [E86.0] Alcoholic ketoacidosis [E87.2] Alcohol withdrawal syndrome without complication (HCC) [F10.230]    Active Problems:   Alcohol intoxication (HCC)      Past Medical History:  Diagnosis Date  . Seizures (HCC)     Past Surgical History:  Procedure Laterality Date  . APPENDECTOMY    . ESOPHAGOGASTRODUODENOSCOPY N/A 07/20/2018   Procedure: ESOPHAGOGASTRODUODENOSCOPY (EGD);  Surgeon: Toney Reil, MD;  Location: [facility] ENDOSCOPY;  Service: Gastroenterology;  Laterality: N/A;  . TONSILLECTOMY         History of present illness and  Hospital Course:     Kindly see H&P for history of present illness and admission details, please review complete Labs, Consult reports and Test reports for all details in brief  HPI  from the history and physical done on the day of admission  58 year old female with no past medical history admitted for alcohol intoxication, withdrawal symptoms and admitted for the same.  Patient also has severe abdominal pain, nausea and vomiting and found to have alcoholic pancreatitis and was admitted for that also.  Hospital Course  #1 alcoholic pancreatitis, patient had abdominal pain, nausea, vomiting, improved with the conservative treatment with IV fluids,  n.p.o., bowel rest with n.p.o., IV pain medicines, IV PPIs.  Patient abdominal pain improved, started on liquid diet and advance to regular diet. 2.  Alcohol withdrawal, patient received CIWA protocol with Ativan, did not have any severe withdrawal symptoms.  No hallucinations.  Continue Ativan. 3.  Alcoholic liver cirrhosis with chronic pancytopenia, no previous labs available. 3.  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 4.  Multiple acute abnormalities, patient received multiple doses of potassium, magnesium, pharmacy is consulted for apparent management. 5.  Alcoholic gastritis, patient received IV PPIs. #6 dizziness: Patient received meclizine.     Discharge Condition: stable   Follow UP  Follow-up Information    Toney Reil, MD. Go on 08/18/2018.   Specialty:  Gastroenterology Why:  Dr. Allegra Lai, Thursday, March 5 at 1:45 p.m.  [phone] Contact information: [address] [place] [address] [phone]             Discharge Instructions  and  Discharge Medications      Allergies as of 07/21/2018      Reactions   Hydrocodone Itching, Rash      Medication List    STOP taking these medications   esomeprazole 20 MG capsule Commonly known as:  NEXIUM     TAKE these medications   folic acid 1 MG tablet Commonly known as:  FOLVITE Take 1 tablet (1 mg total) by mouth daily.   pantoprazole 40 MG tablet Commonly known as:  PROTONIX Take 1 tablet (40 mg total) by mouth 2 (two)  times daily.   propranolol ER 60 MG 24 hr capsule Commonly known as:  INDERAL LA Take 1 capsule (60 mg total) by mouth daily.    thiamine 100 MG tablet Take 1 tablet (100 mg total) by mouth daily.   traZODone 100 MG tablet Commonly known as:  DESYREL Take 1 tablet (100 mg total) by mouth at bedtime.      Advised to quit alcohol.   Diet and Activity recommendation: See Discharge Instructions above   Consults obtained -gastroenterology   Major procedures and Radiology Reports - PLEASE review detailed and final reports for all details, in brief -      Ct Head Wo Contrast  Result Date: 07/13/2018 CLINICAL DATA:  Head trauma. EXAM: CT HEAD WITHOUT CONTRAST TECHNIQUE: Contiguous axial images were obtained from the base of the skull through the vertex without intravenous contrast. COMPARISON:  None. FINDINGS: Brain: Mild atrophy. Negative for hydrocephalus. Negative for acute infarct, hemorrhage, or mass. Vascular: Negative for hyperdense vessel Skull: Negative for skull fracture.  Right parietal scalp contusion Sinuses/Orbits: Negative Other: None IMPRESSION: No acute abnormality.  Generalized atrophy. Electronically Signed   By: Marlan Palauharles  Clark M.D.   On: 07/13/2018 11:43   Ct Abdomen Pelvis W Contrast  Result Date: 07/13/2018 CLINICAL DATA:  Epigastric pain following drinking alcohol, initial encounter EXAM: CT ABDOMEN AND PELVIS WITH CONTRAST TECHNIQUE: Multidetector CT imaging of the abdomen and pelvis was performed using the standard protocol following bolus administration of intravenous contrast. CONTRAST:  75mL ISOVUE-300 IOPAMIDOL (ISOVUE-300) INJECTION 61% COMPARISON:  None. FINDINGS: Lower chest: No acute abnormality. Hepatobiliary: Diffuse fatty infiltration of the liver is noted. The gallbladder is well distended. Pancreas: Unremarkable. No pancreatic ductal dilatation or surrounding inflammatory changes. Spleen: Normal in size without focal abnormality. Adrenals/Urinary Tract: The adrenal glands are within normal limits. Kidneys demonstrate no renal calculi or obstructive changes. Normal excretion of  contrast is noted on delayed images. Bladder is well distended. Stomach/Bowel: Scattered diverticular change of the colon is noted without evidence of diverticulitis. The appendix is not well visualized consistent with a prior surgical history. No small bowel obstructive or inflammatory changes are seen. A duodenal diverticulum is noted adjacent to the uncinate process of the pancreas. Stomach is decompressed. Small sliding-type hiatal hernia is noted. Some thickening of the distal esophagus is noted which may be related to reflux and mucosal edema although the possibility of an obstructing mass could not be totally excluded. Fluoroscopic evaluation may be helpful. Vascular/Lymphatic: Aortic atherosclerosis. No enlarged abdominal or pelvic lymph nodes. Reproductive: Uterus and bilateral adnexa are unremarkable. Other: No abdominal wall hernia or abnormality. No abdominopelvic ascites. Musculoskeletal: Degenerative changes of the lumbar spine are noted. IMPRESSION: Thickening in the distal esophagus which may be related to mucosal edema from reflux although the possibility of a filling defect could not be totally excluded. Fluoroscopic evaluation is recommended. Fatty infiltration of the liver. Chronic changes without acute abnormality. Electronically Signed   By: Alcide CleverMark  Lukens M.D.   On: 07/13/2018 11:46   Dg Esophagus W Double Cm (hd)  Result Date: 07/19/2018 CLINICAL DATA:  Pain and dysphagia. Reported previous stretching procedures EXAM: ESOPHOGRAM / BARIUM SWALLOW / BARIUM TABLET STUDY TECHNIQUE: Combined double contrast and single contrast examination performed using thick barium liquid and thin barium liquid. The patient was observed with fluoroscopy swallowing a 13 mm barium sulphate tablet. FLUOROSCOPY TIME:  Fluoroscopy Time: 2 minutes 42 seconds Radiation Exposure Index (if provided by the fluoroscopic device): 29.10 mGy Number of Acquired Spot Images:  21 COMPARISON:  None. FINDINGS: The patient swallows  without difficulty. Esophageal peristalsis is within normal limits. There is no appreciable hiatal hernia. There is smooth narrowing of the distal 4 cm of the esophagus. No more focal stricture evident. Esophageal contour is otherwise normal. No esophageal mass or ulceration. The pharynx appears normal. A 13 mm barium tablet passed freely to the level of the distal esophageal narrowing. The tablet remained in this area for approximately 15 minutes before partially dissolving and passing into the stomach. IMPRESSION: Moderate smooth narrowing involving the distal 4 cm of the esophagus. A 13 mm barium tablet would not pass through this area intact. The area of narrowing at its maximum measures approximately 9 mm. No other evident esophageal stricture. No mass or ulceration. Mucosa of the esophagus appears unremarkable. Peristalsis overall appears unremarkable. Electronically Signed   By: Bretta Bang III M.D.   On: 07/19/2018 08:56    Micro Results    No results found for this or any previous visit (from the past 240 hour(s)).     Today   Subjective:   Kaitlyn Farrell today has no headache,no chest abdominal pain,no new weakness tingling or numbness, feels much better wants to go home today.  Objective:   Blood pressure 114/80, pulse 66, temperature 98.8 F (37.1 C), temperature source Oral, resp. rate 18, height 5\' 2"  (1.575 m), weight 47.6 kg, SpO2 99 %.  No intake or output data in the 24 hours ending 07/25/18 1021  Exam Awake Alert, Oriented x 3, No new F.N deficits, Normal affect [place].AT,PERRAL Supple Neck,No JVD, No cervical lymphadenopathy appriciated.  Symmetrical Chest wall movement, Good air movement bilaterally, CTAB RRR,No Gallops,Rubs or new Murmurs, No Parasternal Heave +ve B.Sounds, Abd Soft, Non tender, No organomegaly appriciated, No rebound -guarding or rigidity. No Cyanosis, Clubbing or edema, No new Rash or bruise  Data Review   CBC w Diff:  Lab Results   Component Value Date   WBC 4.5 07/20/2018   HGB 10.3 (L) 07/20/2018   HCT 31.3 (L) 07/20/2018   PLT 162 07/20/2018    CMP:  Lab Results  Component Value Date   NA 135 07/19/2018   K 3.8 07/19/2018   CL 100 07/19/2018   CO2 28 07/19/2018   BUN 6 07/19/2018   CREATININE 0.53 07/19/2018   PROT 5.7 (L) 07/15/2018   ALBUMIN 3.5 07/18/2018   BILITOT 4.6 (H) 07/15/2018   ALKPHOS 97 07/15/2018   AST 226 (H) 07/15/2018   ALT 80 (H) 07/15/2018  .   Total Time in preparing paper work, data evaluation and todays exam - 35 minutes  Katha Hamming M.D on 07/21/2018 at 10:21 AM    Note: This dictation was prepared with Dragon dictation along with smaller phrase technology. Any transcriptional errors that result from this process are unintentional.

## 2018-07-26 ENCOUNTER — Ambulatory Visit: Payer: Self-pay

## 2018-07-29 ENCOUNTER — Ambulatory Visit: Payer: Self-pay | Admitting: Pharmacy Technician

## 2018-07-29 DIAGNOSIS — Z79899 Other long term (current) drug therapy: Secondary | ICD-10-CM

## 2018-07-29 NOTE — Progress Notes (Signed)
Completed [facility] application and contract.  Patient agreed to all terms of the [facility] contract.    Patient approved to receive medication assistance at [facility] as long as eligibility criteria continues to be met.    Provided patient with community resource material based on her particular needs.    Referred patient to [facility].  Stefan Markarian J. Dayron Odland Care Manager [facility]  

## 2018-08-02 ENCOUNTER — Encounter: Payer: Self-pay | Admitting: Emergency Medicine

## 2018-08-02 ENCOUNTER — Telehealth: Payer: Self-pay | Admitting: Gastroenterology

## 2018-08-02 ENCOUNTER — Other Ambulatory Visit: Payer: Self-pay

## 2018-08-02 ENCOUNTER — Emergency Department: Payer: Self-pay

## 2018-08-02 ENCOUNTER — Emergency Department
Admission: EM | Admit: 2018-08-02 | Discharge: 2018-08-02 | Disposition: A | Discharge status: Home / Self Care | Payer: Self-pay | Attending: Emergency Medicine | Admitting: Emergency Medicine

## 2018-08-02 DIAGNOSIS — R51 Headache: Secondary | ICD-10-CM | POA: Insufficient documentation

## 2018-08-02 DIAGNOSIS — Z79899 Other long term (current) drug therapy: Secondary | ICD-10-CM | POA: Insufficient documentation

## 2018-08-02 DIAGNOSIS — R600 Localized edema: Secondary | ICD-10-CM | POA: Insufficient documentation

## 2018-08-02 DIAGNOSIS — R519 Headache, unspecified: Secondary | ICD-10-CM

## 2018-08-02 DIAGNOSIS — R21 Rash and other nonspecific skin eruption: Secondary | ICD-10-CM | POA: Insufficient documentation

## 2018-08-02 DIAGNOSIS — R609 Edema, unspecified: Secondary | ICD-10-CM

## 2018-08-02 HISTORY — DX: Acute pancreatitis without necrosis or infection, unspecified: K85.90

## 2018-08-02 LAB — CBC
HCT: 32.6 % — ABNORMAL LOW (ref 36.0–46.0)
Hemoglobin: 10.5 g/dL — ABNORMAL LOW (ref 12.0–15.0)
MCH: 32.3 pg (ref 26.0–34.0)
MCHC: 32.2 g/dL (ref 30.0–36.0)
MCV: 100.3 fL — ABNORMAL HIGH (ref 80.0–100.0)
Platelets: 200 10*3/uL (ref 150–400)
RBC: 3.25 MIL/uL — ABNORMAL LOW (ref 3.87–5.11)
RDW: 14.8 % (ref 11.5–15.5)
WBC: 5.8 10*3/uL (ref 4.0–10.5)
nRBC: 0 % (ref 0.0–0.2)

## 2018-08-02 LAB — BASIC METABOLIC PANEL
Anion gap: 10 (ref 5–15)
BUN: <5 mg/dL — ABNORMAL LOW (ref 6–20)
CO2: 23 mmol/L (ref 22–32)
Calcium: 9 mg/dL (ref 8.9–10.3)
Chloride: 104 mmol/L (ref 98–111)
Creatinine, Ser: 0.53 mg/dL (ref 0.44–1.00)
GFR calc Af Amer: >60 mL/min (ref >60)
GFR calc non Af Amer: >60 mL/min (ref >60)
Glucose, Bld: 89 mg/dL (ref 70–99)
Potassium: 3.8 mmol/L (ref 3.5–5.1)
Sodium: 137 mmol/L (ref 135–145)

## 2018-08-02 LAB — LIPASE, BLOOD: Lipase: 37 U/L (ref 11–51)

## 2018-08-02 LAB — TROPONIN I: Troponin I: <0.03 ng/mL (ref <0.03)

## 2018-08-02 MED ORDER — PANTOPRAZOLE SODIUM 40 MG PO TBEC: 40.0000 mg | DELAYED_RELEASE_TABLET | Freq: Every day | ORAL | 1 refills | Status: DC

## 2018-08-02 MED ORDER — ZOLPIDEM TARTRATE ER 12.5 MG PO TBCR: 12.5000 mg | EXTENDED_RELEASE_TABLET | Freq: Every evening | ORAL | 0 refills | Status: AC | PRN

## 2018-08-02 MED ORDER — IBUPROFEN 400 MG PO TABS
400.0000 mg | ORAL_TABLET | Freq: Once | ORAL | Status: AC | PRN
  Administered 2018-08-02: 400 mg via ORAL
  Filled 2018-08-02: qty 1

## 2018-08-02 NOTE — ED Notes (Addendum)
Pt requesting medication for headache. States c/o anxiety and wants her anxiety medication. Ibuprofen ordered per acute pain standing order set. Explained that anxiety medication will need to be ordered by MD. Emotional support provided.

## 2018-08-02 NOTE — ED Notes (Signed)
UA collected and sent to the lab.

## 2018-08-02 NOTE — ED Provider Notes (Signed)
[facility] Emergency Department Provider Note   ____________________________________________    I have reviewed the triage vital signs and the nursing notes.   HISTORY  Chief Complaint Leg Swelling and Rash     HPI Kaitlyn Farrell is a 58 y.o. female who presents with multiple complaints.  Patient reports recently discharged from the hospital for alcohol intoxication/withdrawal symptoms, has been alcohol free since then but has had significant problems with anxiety, fluid retention.  She also describes some burning in her epigastrium which seems to be worse at night although has been holding off on her Protonix because she was worried it was causing fluid retention.  Denies fevers or chills.  No cough.  She also reports some memory issues which have been ongoing.  No neuro deficits otherwise.  Past Medical History:  Diagnosis Date  . Pancreatitis   . Seizures [facility])     Patient Active Problem List   Diagnosis Date Noted  . Alcohol intoxication (HCC) 07/13/2018    Past Surgical History:  Procedure Laterality Date  . APPENDECTOMY    . ESOPHAGOGASTRODUODENOSCOPY N/A 07/20/2018   Procedure: ESOPHAGOGASTRODUODENOSCOPY (EGD);  Surgeon: Toney Reil, MD;  Location: [facility] ENDOSCOPY;  Service: Gastroenterology;  Laterality: N/A;  . TONSILLECTOMY      Prior to Admission medications   Medication Sig Start Date End Date Taking? Authorizing Provider  folic acid (FOLVITE) 1 MG tablet Take 1 tablet (1 mg total) by mouth daily. 07/21/18   Katha Hamming, MD  pantoprazole (PROTONIX) 40 MG tablet Take 1 tablet (40 mg total) by mouth daily. 08/02/18 08/02/19  Jene Every, MD  propranolol ER (INDERAL LA) 60 MG 24 hr capsule Take 1 capsule (60 mg total) by mouth daily. 07/21/18 07/21/19  Katha Hamming, MD  thiamine 100 MG tablet Take 1 tablet (100 mg total) by mouth daily. 07/21/18   Katha Hamming, MD  traZODone (DESYREL) 100 MG tablet Take 1  tablet (100 mg total) by mouth at bedtime. 07/21/18   Katha Hamming, MD  zolpidem (AMBIEN CR) 12.5 MG CR tablet Take 1 tablet (12.5 mg total) by mouth at bedtime as needed for sleep. 08/02/18 08/02/19  Jene Every, MD     Allergies Hydrocodone  Family History  Family history unknown: Yes    Social History Social History   Tobacco Use  . Smoking status: Never Smoker  . Smokeless tobacco: Never Used  Substance Use Topics  . Alcohol use: Not Currently    Comment: 1/2 gallon of liquor/day  . Drug use: Never    Review of Systems  Constitutional: No fever/chills Eyes: No visual changes.  ENT: No neck pain Cardiovascular: Denies chest pain. Respiratory: Denies shortness of breath. Gastrointestinal:   No nausea, no vomiting.   Genitourinary: Negative for dysuria. Musculoskeletal: Negative for back pain. Skin: Intermittent rash Neurological: Negative for weakness   ____________________________________________   PHYSICAL EXAM:  VITAL SIGNS: ED Triage Vitals  Enc Vitals Group     BP 08/02/18 1104 119/84     Pulse Rate 08/02/18 1104 66     Resp 08/02/18 1104 16     Temp 08/02/18 1104 (!) 97.5 F (36.4 C)     Temp Source 08/02/18 1104 Oral     SpO2 08/02/18 1104 100 %     Weight 08/02/18 1105 47.6 kg (104 lb 15 oz)     Height 08/02/18 1105 1.575 m (5\' 2" )     Head Circumference --      Peak Flow --  Pain Score 08/02/18 1104 8     Pain Loc --      Pain Edu? --      Excl. in GC? --     Constitutional: Alert and oriented.  Anxious Eyes: Conjunctivae are normal.  Head: Atraumatic.   Cardiovascular: Normal rate, regular rhythm. Grossly normal heart sounds.  Good peripheral circulation. Respiratory: Normal respiratory effort.  No retractions. Lungs CTAB. Gastrointestinal: Soft and nontender. No distention.  No CVA  Musculoskeletal: No lower extremity tenderness nor edema.  Warm and well perfused Neurologic:  Normal speech and language. No gross focal  neurologic deficits are appreciated.  Cranial nerves II through XII are normal Skin:  Skin is warm, dry and intact. No rash noted. Psychiatric: Mood and affect are normal. Speech and behavior are normal.  ____________________________________________   LABS (all labs ordered are listed, but only abnormal results are displayed)  Labs Reviewed  BASIC METABOLIC PANEL - Abnormal; Notable for the following components:      Result Value   BUN <5 (*)    All other components within normal limits  CBC - Abnormal; Notable for the following components:   RBC 3.25 (*)    Hemoglobin 10.5 (*)    HCT 32.6 (*)    MCV 100.3 (*)    All other components within normal limits  TROPONIN I  LIPASE, BLOOD   ____________________________________________  EKG  ED ECG REPORT I, Jene Every, the attending physician, personally viewed and interpreted this ECG.  Date: 08/02/2018  Rhythm: normal sinus rhythm QRS Axis: normal Intervals: normal ST/T Wave abnormalities: normal Narrative Interpretation: no evidence of acute ischemia  ____________________________________________  RADIOLOGY  Chest x-ray normal ____________________________________________   PROCEDURES  Procedure(s) performed: No  Procedures   Critical Care performed: No ____________________________________________   INITIAL IMPRESSION / ASSESSMENT AND PLAN / ED COURSE  Pertinent labs & imaging results that were available during my care of the patient were reviewed by me and considered in my medical decision making (see chart for details).  Patient presents with multiple complaints as noted above, suspect peripheral edema related to fluid overload because of IV fluids in the hospital, this seems to be improving.  She also complains of a headache which is intermittent as well which she states has been ongoing since she stopped drinking alcohol.  Intermittent red spots "on her legs, none seen today.  She also complains of  difficulty sleeping and concerned that she is running out of her Protonix, we will refill Protonix, try Ambien for sleeping and I attempted to have her seen by TTS however they had to leave    ____________________________________________   FINAL CLINICAL IMPRESSION(S) / ED DIAGNOSES  Final diagnoses:  Peripheral edema  Rash  Nonintractable headache, unspecified chronicity pattern, unspecified headache type        Note:  This document was prepared using Dragon voice recognition software and may include unintentional dictation errors.   Jene Every, MD 08/02/18 [phone]

## 2018-08-02 NOTE — ED Triage Notes (Signed)
For past few days patient has swelling in lower extrems and in abdomen.  Headaches and dizziness.  Also bruising.  Anxiety.  Recent admission for pancreatitis.  She is 24 days sober.

## 2018-08-02 NOTE — Telephone Encounter (Signed)
Patient called in stating she was a Dr Allegra Lai patient and was treated as an in patient from 06-19-2018 to 07-21-2018. Patient feels she is having an allergic reaction with swelling of arms,legs sharpe pain going down arm. Patient looked on the Internet and feels it's the Pantoprazole 40 mg 1 tab 2 x's a day. She cut back  To 1 tab once a day. I spoke with Dr Allegra Lai regarding patient and she said tell her to go to the ED for further evaluation. Patient states understanding!!!

## 2018-08-02 NOTE — ED Notes (Signed)
Gave pt urine cup with bag. Waiting on sample.

## 2018-08-02 NOTE — ED Notes (Signed)
Pt updated on plan of care and wait time.

## 2018-08-18 ENCOUNTER — Encounter: Payer: Self-pay | Admitting: Gastroenterology

## 2018-08-18 ENCOUNTER — Ambulatory Visit (INDEPENDENT_AMBULATORY_CARE_PROVIDER_SITE_OTHER): Payer: Self-pay | Admitting: Gastroenterology

## 2018-08-18 ENCOUNTER — Other Ambulatory Visit: Payer: Self-pay

## 2018-08-18 VITALS — BP 143/96 | HR 62 | Resp 17 | Wt 115.4 lb

## 2018-08-18 DIAGNOSIS — K222 Esophageal obstruction: Secondary | ICD-10-CM

## 2018-08-18 DIAGNOSIS — F411 Generalized anxiety disorder: Secondary | ICD-10-CM

## 2018-08-18 DIAGNOSIS — D539 Nutritional anemia, unspecified: Secondary | ICD-10-CM

## 2018-08-18 MED ORDER — PANTOPRAZOLE SODIUM 40 MG PO TBEC: 40.0000 mg | DELAYED_RELEASE_TABLET | Freq: Two times a day (BID) | ORAL | 0 refills | Status: AC

## 2018-08-18 MED ORDER — AMITRIPTYLINE HCL 25 MG PO TABS: 25.0000 mg | ORAL_TABLET | Freq: Every day | ORAL | 0 refills | Status: AC

## 2018-08-18 NOTE — Progress Notes (Signed)
Arlyss Repress, MD [address]  [address]  [place], [place] [address]  Main: [phone]  Fax: [phone]    Gastroenterology Consultation  Referring Provider:     No ref. provider found Primary Care Physician:  Patient, No Pcp Per Primary Gastroenterologist:  Dr. Arlyss Repress Reason for Consultation:     Hospital follow-up, esophageal stricture        HPI:   Kaitlyn Farrell is a 58 y.o. female as a hospital follow-up of recent admission for epigastric pain, dysphagia.  She has history of alcohol abuse, alcoholic liver disease, with alcoholic pancreatitis in 06/2018 which has resolved.  She is admitted to [facility] in early February secondary to severe chest tightness and with previous history of esophageal stricture.  She underwent CT chest as well as the barium swallow study which revealed narrowing of distal esophagus.  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  Patient was discharged home on Protonix 40 mg twice daily.  She reports that her symptoms are significantly improved since dilation and on acid suppressive therapy.  She had prescription only for 1 month.  After that, her symptoms recurred and she went to ER on 08/02/2018, was discharged on Protonix 40 mg daily.  Patient does not have insurance, is receiving medications from [facility].  She recently moved from [place] and trying to establish primary care here.  She is taking omeprazole 40 mg daily from her sister which is helping with her chest tightness.  She otherwise denies drinking alcohol and reports doing well.  Since she moved here, she reports having anxiety attacks and does not go out much.  She was given propranolol by the ER physician which she took for few days, resulted in nausea, therefore stopped taking it.   NSAIDs: None  Antiplts/Anticoagulants/Anti thrombotics: None  GI Procedures:  EGD 07/20/2018 - Normal duodenal bulb and second  portion of the duodenum. - Portal hypertensive gastropathy. - 1 cm hiatal hernia. - Esophagogastric landmarks identified. - Benign-appearing esophageal stenosis. Dilated to 33mm. - No specimens collected.   Past Medical History:  Diagnosis Date  . Pancreatitis   . Seizures (HCC)     Past Surgical History:  Procedure Laterality Date  . APPENDECTOMY    . ESOPHAGOGASTRODUODENOSCOPY N/A 07/20/2018   Procedure: ESOPHAGOGASTRODUODENOSCOPY (EGD);  Surgeon: Toney Reil, MD;  Location[facility] ENDOSCOPY;  Service: Gastroenterology;  Laterality: N/A;  . TONSILLECTOMY      Current Outpatient Medications:  .  folic acid (FOLVITE) 1 MG tablet, Take 1 tablet (1 mg total) by mouth daily., Disp: 30 tablet, Rfl: 0 .  pantoprazole (PROTONIX) 40 MG tablet, Take 1 tablet (40 mg total) by mouth 2 (two) times daily before a meal., Disp: 180 tablet, Rfl: 0 .  propranolol ER (INDERAL LA) 60 MG 24 hr capsule, Take 1 capsule (60 mg total) by mouth daily., Disp: 30 capsule, Rfl: 0 .  thiamine 100 MG tablet, Take 1 tablet (100 mg total) by mouth daily., Disp: 30 tablet, Rfl: 0 .  traZODone (DESYREL) 100 MG tablet, Take 1 tablet (100 mg total) by mouth at bedtime., Disp: 30 tablet, Rfl: 0 .  zolpidem (AMBIEN CR) 12.5 MG CR tablet, Take 1 tablet (12.5 mg total) by mouth at bedtime as needed for sleep., Disp: 30 tablet, Rfl: 0 .  amitriptyline (ELAVIL) 25 MG tablet, Take 1 tablet (25 mg total) by mouth at bedtime for 30 days., Disp:  30 tablet, Rfl: 0    Family History  Family history unknown: Yes     Social History   Tobacco Use  . Smoking status: Never Smoker  . Smokeless tobacco: Never Used  Substance Use Topics  . Alcohol use: Not Currently    Comment: 1/2 gallon of liquor/day  . Drug use: Never    Allergies as of 08/18/2018 - Review Complete 08/18/2018  Allergen Reaction Noted  . Hydrocodone Itching and Rash 07/13/2018    Review of Systems:    All systems reviewed and negative except  where noted in HPI.   Physical Exam:  BP (!) 143/96 (BP Location: Left Arm, Patient Position: Sitting, Cuff Size: Normal)   Pulse 62   Resp 17   Wt 115 lb 6.4 oz (52.3 kg)   BMI 21.11 kg/m  No LMP recorded. Patient is postmenopausal.  General:   Alert,  Well-developed, well-nourished, pleasant and cooperative in NAD Head:  Normocephalic and atraumatic. Eyes:  Sclera clear, no icterus.   Conjunctiva pink. Ears:  Normal auditory acuity. Nose:  No deformity, discharge, or lesions. Mouth:  No deformity or lesions,oropharynx pink & moist. Neck:  Supple; no masses or thyromegaly. Lungs:  Respirations even and unlabored.  Clear throughout to auscultation.   No wheezes, crackles, or rhonchi. No acute distress. Heart:  Regular rate and rhythm; no murmurs, clicks, rubs, or gallops. Abdomen:  Normal bowel sounds. Soft, non-tender and non-distended without masses, hepatosplenomegaly or hernias noted.  No guarding or rebound tenderness.   Rectal: Not performed Msk:  Symmetrical without gross deformities. Good, equal movement & strength bilaterally. Pulses:  Normal pulses noted. Extremities:  No clubbing or edema.  No cyanosis. Neurologic:  Alert and oriented x3;  grossly normal neurologically. Skin:  Intact without significant lesions or rashes. No jaundice. Psych:  Alert and cooperative. Normal mood and affect.  Imaging Studies: Reviewed  Assessment and Plan:   Kaitlyn Farrell is a 58 y.o. pleasant Caucasian female with history of alcohol use, alcoholic hepatitis and alcoholic pancreatitis is seen as a hospital follow-up for esophageal stricture status post dilation  Dysphagia: Secondary to esophageal stricture most likely peptic stricture in setting of alcohol use Currently asymptomatic since dilation of the stricture Continue Protonix 40 mg twice daily, prescription provided today We will consider repeat EGD in 3 months  History of alcoholic liver disease Check LFTs  today  Macrocytic anemia Recheck CBC, ferritin, B12 and folate levels  Colon cancer screening Will discuss with patient about colonoscopy during next visit  Generalized anxiety Advised her to try amitriptyline 25 mg at bedtime, patient is willing to try Prescription for 30 days provided  Follow up in 3 months   Arlyss Repress, MD

## 2018-08-25 ENCOUNTER — Ambulatory Visit: Payer: Self-pay | Admitting: Gerontology

## 2018-08-31 ENCOUNTER — Encounter (INDEPENDENT_AMBULATORY_CARE_PROVIDER_SITE_OTHER): Payer: Self-pay

## 2018-09-06 ENCOUNTER — Ambulatory Visit: Payer: Self-pay | Admitting: Gerontology

## 2018-10-14 DEATH — deceased

## 2018-11-17 ENCOUNTER — Ambulatory Visit: Payer: Self-pay | Admitting: Gastroenterology
